# Patient Record
Sex: Female | Born: 1985
Health system: Southern US, Community
[De-identification: ages and names within clinical notes are randomized; demographics above are authoritative.]

## PROBLEM LIST (undated history)

## (undated) DIAGNOSIS — F329 Major depressive disorder, single episode, unspecified: Secondary | ICD-10-CM

## (undated) DIAGNOSIS — R519 Headache, unspecified: Secondary | ICD-10-CM

## (undated) DIAGNOSIS — R51 Headache: Secondary | ICD-10-CM

## (undated) DIAGNOSIS — F410 Panic disorder [episodic paroxysmal anxiety] without agoraphobia: Secondary | ICD-10-CM

## (undated) DIAGNOSIS — F32A Depression, unspecified: Secondary | ICD-10-CM

## (undated) HISTORY — PX: ABDOMINAL SURGERY: SHX537

## (undated) HISTORY — DX: Headache: R51

## (undated) HISTORY — DX: Major depressive disorder, single episode, unspecified: F32.9

## (undated) HISTORY — PX: EYE SURGERY: SHX253

## (undated) HISTORY — DX: Headache, unspecified: R51.9

## (undated) HISTORY — DX: Depression, unspecified: F32.A

---

## 2014-12-16 ENCOUNTER — Encounter (HOSPITAL_COMMUNITY): Payer: Self-pay

## 2014-12-16 ENCOUNTER — Emergency Department (HOSPITAL_COMMUNITY)
Admission: EM | Admit: 2014-12-16 | Discharge: 2014-12-16 | Disposition: A | Discharge status: Home / Self Care | Payer: Non-veteran care | Attending: Emergency Medicine | Admitting: Emergency Medicine

## 2014-12-16 DIAGNOSIS — Z8659 Personal history of other mental and behavioral disorders: Secondary | ICD-10-CM | POA: Diagnosis not present

## 2014-12-16 DIAGNOSIS — R002 Palpitations: Secondary | ICD-10-CM

## 2014-12-16 HISTORY — DX: Panic disorder (episodic paroxysmal anxiety): F41.0

## 2014-12-16 LAB — BASIC METABOLIC PANEL
Anion gap: 6 (ref 5–15)
BUN: 15 mg/dL (ref 6–23)
CO2: 25 mmol/L (ref 19–32)
Calcium: 8.9 mg/dL (ref 8.4–10.5)
Chloride: 104 mmol/L (ref 96–112)
Creatinine, Ser: 0.74 mg/dL (ref 0.50–1.10)
GFR calc Af Amer: >90 mL/min (ref >90)
GFR calc non Af Amer: >90 mL/min (ref >90)
Glucose, Bld: 101 mg/dL — ABNORMAL HIGH (ref 70–99)
Potassium: 4.3 mmol/L (ref 3.5–5.1)
Sodium: 135 mmol/L (ref 135–145)

## 2014-12-16 LAB — I-STAT TROPONIN, ED: Troponin i, poc: 0 ng/mL (ref 0.00–0.08)

## 2014-12-16 LAB — CBC
HCT: 38.3 % (ref 36.0–46.0)
Hemoglobin: 13.4 g/dL (ref 12.0–15.0)
MCH: 29.8 pg (ref 26.0–34.0)
MCHC: 35 g/dL (ref 30.0–36.0)
MCV: 85.1 fL (ref 78.0–100.0)
Platelets: 210 10*3/uL (ref 150–400)
RBC: 4.5 MIL/uL (ref 3.87–5.11)
RDW: 14.1 % (ref 11.5–15.5)
WBC: 7.7 10*3/uL (ref 4.0–10.5)

## 2014-12-16 NOTE — ED Notes (Signed)
Pt verbalized understanding of d/c instructions and given information to follow up with a cardiologist and has no further questions.

## 2014-12-16 NOTE — ED Provider Notes (Signed)
CSN: [id number]     Arrival date & time 12/16/14  0247 History   First MD Initiated Contact with Patient 12/16/14 0545     Chief Complaint  Patient presents with  . Palpitations     (Consider location/radiation/quality/duration/timing/severity/associated sxs/prior Treatment) Patient is a 29 y.o. female presenting with palpitations. The history is provided by the patient.  Palpitations She woke up at about 12:30 AM with a sense that her heart was beating faster than it should. There was no chest pain, heaviness, tightness, pressure. There is mild dyspnea but no nausea or diaphoresis. She did feel somewhat lightheaded and noted dry mouth. She was not aware of any numbness in her hands or feet or any circumoral numbness. Symptoms lasted about 20 minutes before resolving. She did check her pulse during this episode, and it was about 100.  Past Medical History  Diagnosis Date  . Panic attack    Past Surgical History  Procedure Laterality Date  . Eye surgery     History reviewed. No pertinent family history. History  Substance Use Topics  . Smoking status: Never Smoker   . Smokeless tobacco: Not on file  . Alcohol Use: Yes     Comment: social   OB History    No data available     Review of Systems  Unable to perform ROS Cardiovascular: Positive for palpitations.  All other systems reviewed and are negative.     Allergies  Ceclor  Home Medications   Prior to Admission medications   Medication Sig Start Date End Date Taking? Authorizing Provider  Multiple Vitamins-Calcium (ONE-A-DAY WOMENS PO) Take 1 tablet by mouth daily.   Yes Historical Provider, MD   BP 93/63 mmHg  Pulse 87  Temp(Src) 98.2 F (36.8 C) (Oral)  Resp 15  Ht  (1.6 m)  Wt 125 lb (56.7 kg)  BMI 22.15 kg/m2  SpO2 100%  LMP 11/15/2014 Physical Exam 29 year old female, resting comfortably and in no acute distress. Vital signs are normal. Oxygen saturation is 100%, which is normal. Head is  normocephalic and atraumatic. PERRLA, EOMI. Oropharynx is clear. Neck is nontender and supple without adenopathy or JVD. Back is nontender and there is no CVA tenderness. Lungs are clear without rales, wheezes, or rhonchi. Chest is nontender. Heart has regular rate and rhythm without murmur. Abdomen is soft, flat, nontender without masses or hepatosplenomegaly and peristalsis is normoactive. Extremities have no cyanosis or edema, full range of motion is present. Skin is warm and dry without rash. Neurologic: Mental status is normal, cranial nerves are intact, there are no motor or sensory deficits.  ED Course  Procedures (including critical care time) Labs Review Results for orders placed or performed during the hospital encounter of 12/16/14  CBC  Result Value Ref Range   WBC 7.7 4.0 - 10.5 K/uL   RBC 4.50 3.87 - 5.11 MIL/uL   Hemoglobin 13.4 12.0 - 15.0 g/dL   HCT 40.9 81.1 - 91.4 %   MCV 85.1 78.0 - 100.0 fL   MCH 29.8 26.0 - 34.0 pg   MCHC 35.0 30.0 - 36.0 g/dL   RDW 78.2 95.6 - 21.3 %   Platelets 210 150 - 400 K/uL  Basic metabolic panel  Result Value Ref Range   Sodium 135 135 - 145 mmol/L   Potassium 4.3 3.5 - 5.1 mmol/L   Chloride 104 96 - 112 mmol/L   CO2 25 19 - 32 mmol/L   Glucose, Bld 101 (H) 70 - 99  mg/dL   BUN 15 6 - 23 mg/dL   Creatinine, Ser 0.74 0.50 - 1.10 mg/dL   Calcium 8.9 8.4 - 81.110.5 mg/dL   GFR calc non Af Amer >90 >90 mL/min   GFR calc4.09 Af Amer >90 >90 mL/min   Anion gap 6 5 - 15  I-stat troponin, ED ([facility])  Result Value Ref Range   Troponin i, poc 0.00 0.00 - 0.08 ng/mL   Comment 3            EKG Interpretation   Date/Time:  Friday December 16 2014 02:52:00 EDT Ventricular Rate:  93 PR Interval:  120 QRS Duration: 80 QT Interval:  326 QTC Calculation: 405 R Axis:   75 Text Interpretation:  Normal sinus rhythm Normal ECG No old tracing to  compare Confirmed by [facility]  MD, Andreka Stucki ([id number]) on 12/16/2014 2:54:34 AM      MDM   Final  diagnoses:  Palpitations    Palpitations with what clinically sounds like an episode of hyperventilation. I am not sure if her counting the was accurate. I have discussed with her diagnostic options which would rest mainly on an event monitor. She is referred to cardiology for outpatient follow-up. Laboratory workup in the ED was unremarkable.    Dione Boozeavid Timesha Cervantez, MD 12/16/14 [phone]

## 2014-12-16 NOTE — ED Notes (Signed)
Pt here for palpitations while sleeping, sts woke her around 1230, hx of panic attacks but never been woke up from sleep with it. Denies pain. sts did have episode of lightheadedness with episode.

## 2014-12-16 NOTE — Discharge Instructions (Signed)

## 2015-09-13 ENCOUNTER — Encounter (HOSPITAL_COMMUNITY): Payer: Self-pay | Admitting: Emergency Medicine

## 2015-09-13 ENCOUNTER — Emergency Department (HOSPITAL_COMMUNITY)
Admission: EM | Admit: 2015-09-13 | Discharge: 2015-09-14 | Disposition: A | Discharge status: Home / Self Care | Payer: Medicaid Other | Attending: Emergency Medicine | Admitting: Emergency Medicine

## 2015-09-13 DIAGNOSIS — N730 Acute parametritis and pelvic cellulitis: Secondary | ICD-10-CM

## 2015-09-13 DIAGNOSIS — Z79899 Other long term (current) drug therapy: Secondary | ICD-10-CM | POA: Diagnosis not present

## 2015-09-13 DIAGNOSIS — Z8659 Personal history of other mental and behavioral disorders: Secondary | ICD-10-CM | POA: Diagnosis not present

## 2015-09-13 DIAGNOSIS — R102 Pelvic and perineal pain: Secondary | ICD-10-CM

## 2015-09-13 DIAGNOSIS — R112 Nausea with vomiting, unspecified: Secondary | ICD-10-CM | POA: Diagnosis present

## 2015-09-13 DIAGNOSIS — Z3202 Encounter for pregnancy test, result negative: Secondary | ICD-10-CM | POA: Diagnosis not present

## 2015-09-13 DIAGNOSIS — B9689 Other specified bacterial agents as the cause of diseases classified elsewhere: Secondary | ICD-10-CM

## 2015-09-13 DIAGNOSIS — N760 Acute vaginitis: Secondary | ICD-10-CM | POA: Insufficient documentation

## 2015-09-13 LAB — URINALYSIS, ROUTINE W REFLEX MICROSCOPIC
Bilirubin Urine: NEGATIVE
Glucose, UA: NEGATIVE mg/dL
Hgb urine dipstick: NEGATIVE
Ketones, ur: 40 mg/dL — AB
Leukocytes, UA: NEGATIVE
Nitrite: NEGATIVE
Protein, ur: NEGATIVE mg/dL
Specific Gravity, Urine: 1.028 (ref 1.005–1.030)
pH: 6.5 (ref 5.0–8.0)

## 2015-09-13 LAB — CBC
HCT: 39.3 % (ref 36.0–46.0)
Hemoglobin: 13.2 g/dL (ref 12.0–15.0)
MCH: 29.7 pg (ref 26.0–34.0)
MCHC: 33.6 g/dL (ref 30.0–36.0)
MCV: 88.3 fL (ref 78.0–100.0)
Platelets: 256 10*3/uL (ref 150–400)
RBC: 4.45 MIL/uL (ref 3.87–5.11)
RDW: 12.9 % (ref 11.5–15.5)
WBC: 9.7 10*3/uL (ref 4.0–10.5)

## 2015-09-13 LAB — COMPREHENSIVE METABOLIC PANEL
ALT: 22 U/L (ref 14–54)
AST: 24 U/L (ref 15–41)
Albumin: 4 g/dL (ref 3.5–5.0)
Alkaline Phosphatase: 39 U/L (ref 38–126)
Anion gap: 11 (ref 5–15)
BUN: 11 mg/dL (ref 6–20)
CO2: 24 mmol/L (ref 22–32)
Calcium: 9.4 mg/dL (ref 8.9–10.3)
Chloride: 103 mmol/L (ref 101–111)
Creatinine, Ser: 0.8 mg/dL (ref 0.44–1.00)
GFR calc Af Amer: >60 mL/min (ref >60)
GFR calc non Af Amer: >60 mL/min (ref >60)
Glucose, Bld: 96 mg/dL (ref 65–99)
Potassium: 3.9 mmol/L (ref 3.5–5.1)
Sodium: 138 mmol/L (ref 135–145)
Total Bilirubin: 0.9 mg/dL (ref 0.3–1.2)
Total Protein: 6.9 g/dL (ref 6.5–8.1)

## 2015-09-13 LAB — LIPASE, BLOOD: Lipase: 21 U/L (ref 11–51)

## 2015-09-13 LAB — POC URINE PREG, ED: Preg Test, Ur: NEGATIVE

## 2015-09-13 MED ORDER — ONDANSETRON 4 MG PO TBDP
4.0000 mg | ORAL_TABLET | Freq: Once | ORAL | Status: AC
  Administered 2015-09-13: 4 mg via ORAL
  Filled 2015-09-13: qty 1

## 2015-09-13 NOTE — ED Notes (Signed)
Pt. reports persistent nausea/emesis onset today , fatigue , " shaking" , pt. suspects " alcohol poisoning" states large amount of ETOH intake last night . Denies hallucinations / respirations unlabored.

## 2015-09-13 NOTE — ED Provider Notes (Signed)
By signing my name below, I, Soijett Blue, attest that this documentation has been prepared under the direction and in the presence of Enbridge EnergyKristen N Zimir Kittleson, DO. Electronically Signed: Soijett Blue, ED Scribe. 09/13/2015. 11:27 PM.  TIME SEEN: 11:19 PM  CHIEF COMPLAINT: Vomiting  HPI:  Amy Cooke is a 29 y.o. female who presents to the Emergency Department complaining of persistent vomiting x 10 onset 8 AM today. She notes that she is concerned about "dehydration and electrolyte balances" and possible "alcohol poisoning" due to the large amount of alcohol intake last night and she is unsure of how much she consumed. She states that she is having associated symptoms of nausea and white vaginal discharge. She is not currently sexually active but her last sexual encounter was several months ago. She states that she has not concern for STDs at this time. States she has never had an STD before and gets checked regularly. She states that she has not tried any medications for the relief for her symptoms. She denies fever, abdominal pain, diarrhea, dysuria, hematuria, vaginal bleeding, malodorous vaginal odor, vaginal itching or burning, and any other symptoms. Denies having an OB-GYN at this time. Denies recent travel outside of the country. Denies sick contacts. She notes that she has a child and she denies concern for pregnancy at this time. Patient's last menstrual period was 08/30/2015.   ROS: See HPI Constitutional: no fever  Eyes: no drainage  ENT: no runny nose   Cardiovascular:  no chest pain  Resp: no SOB  GI:  vomiting GU: no dysuria Integumentary: no rash  Allergy: no hives  Musculoskeletal: no leg swelling  Neurological: no slurred speech ROS otherwise negative  PAST MEDICAL HISTORY/PAST SURGICAL HISTORY:  Past Medical History  Diagnosis Date  . Panic attack     MEDICATIONS:  Prior to Admission medications   Medication Sig Start Date End Date Taking? Authorizing Provider  Multiple  Vitamins-Calcium (ONE-A-DAY WOMENS PO) Take 1 tablet by mouth daily.    Historical Provider, MD    ALLERGIES:  Allergies  Allergen Reactions  . Ceclor [Cefaclor] Other (See Comments)    Childhood allergy    SOCIAL HISTORY:  Social History  Substance Use Topics  . Smoking status: Never Smoker   . Smokeless tobacco: Not on file  . Alcohol Use: Yes     Comment: social    FAMILY HISTORY: No family history on file.  EXAM: BP 106/63 mmHg  Pulse 76  Temp(Src) 98.5 F (36.9 C) (Oral)  Resp 18  SpO2 100%  LMP 08/30/2015 (Approximate) CONSTITUTIONAL: Alert and oriented and responds appropriately to questions. Well-appearing; well-nourished HEAD: Normocephalic EYES: Conjunctivae clear, PERRL ENT: normal nose; no rhinorrhea; moist mucous membranes; pharynx without lesions noted NECK: Supple, no meningismus, no LAD  CARD: RRR; S1 and S2 appreciated; no murmurs, no clicks, no rubs, no gallops RESP: Normal chest excursion without splinting or tachypnea; breath sounds clear and equal bilaterally; no wheezes, no rhonchi, no rales, no hypoxia or respiratory distress, speaking full sentences ABD/GI: Normal bowel sounds; non-distended; soft, non-tender, no rebound, no guarding, no peritoneal signs GU: nl external genitalia. No vaginal bleeding. Large amount of white yellow thin vaginal discharge with mild bilateral adnexal tenderness without fullness. Positive cervical motion tenderness.  BACK:  The back appears normal and is non-tender to palpation, there is no CVA tenderness EXT: Normal ROM in all joints; non-tender to palpation; no edema; normal capillary refill; no cyanosis, no calf tenderness or swelling    SKIN: Normal color  for age and race; warm NEURO: Moves all extremities equally, sensation to light touch intact diffusely, cranial nerves II through XII intact PSYCH: The patient's mood and manner are appropriate. Grooming and personal hygiene are appropriate.  MEDICAL DECISION  MAKING: Patient here with complaints of nausea and vomiting likely from alcohol intake last night. Her abdominal exam is completely benign. Labs are unremarkable. Urine shows small amount of ketones. She is given Zofran and she is drinking without any difficulty currently. We'll encourage oral fluids. Pelvic exam reveals cervical motion tenderness and bilateral adnexal tenderness without fullness. Wet prep, gonorrhea and Chlamydia pending. Have offered HIV and syphilis testing which she declines. Will obtain transvaginal ultrasound to evaluate for TOA, torsion. Pregnancy test is negative.  ED PROGRESS: Patient has a normal transvaginal ultrasound. Wet prep is positive for clue cells. She is allergic to Ceclor but does not remember her reaction. Discussed with pharmacist and according to up-to-date recommended treatment with Levaquin and Flagyl. We'll discharge with prescriptions for the same. Have given her outpatient OB/GYN follow-up as well. Discussed return precautions. She verbalized understanding and is comfortable with this plan.    I personally performed the services described in this documentation, which was scribed in my presence. The recorded information has been reviewed and is accurate.       Layla Maw Elen Acero, DO 09/14/15 [phone]

## 2015-09-14 ENCOUNTER — Emergency Department (HOSPITAL_COMMUNITY): Payer: Medicaid Other

## 2015-09-14 LAB — WET PREP, GENITAL
Sperm: NONE SEEN
Trich, Wet Prep: NONE SEEN
Yeast Wet Prep HPF POC: NONE SEEN

## 2015-09-14 LAB — GC/CHLAMYDIA PROBE AMP (~~LOC~~) NOT AT ARMC
Chlamydia: NEGATIVE
Neisseria Gonorrhea: NEGATIVE

## 2015-09-14 MED ORDER — METRONIDAZOLE 500 MG PO TABS: 500.0000 mg | ORAL_TABLET | Freq: Two times a day (BID) | ORAL | Status: DC

## 2015-09-14 MED ORDER — ONDANSETRON 4 MG PO TBDP: 4.0000 mg | ORAL_TABLET | Freq: Two times a day (BID) | ORAL | Status: DC

## 2015-09-14 MED ORDER — CEFTRIAXONE SODIUM 250 MG IJ SOLR
250.0000 mg | Freq: Once | INTRAMUSCULAR | Status: DC
  Filled 2015-09-14: qty 250

## 2015-09-14 MED ORDER — AZITHROMYCIN 250 MG PO TABS
2000.0000 mg | ORAL_TABLET | Freq: Once | ORAL | Status: AC
  Administered 2015-09-14: 2000 mg via ORAL
  Filled 2015-09-14: qty 8

## 2015-09-14 MED ORDER — DOXYCYCLINE HYCLATE 100 MG PO TABS
100.0000 mg | ORAL_TABLET | Freq: Once | ORAL | Status: AC
  Administered 2015-09-14: 100 mg via ORAL
  Filled 2015-09-14: qty 1

## 2015-09-14 MED ORDER — METRONIDAZOLE 500 MG PO TABS
500.0000 mg | ORAL_TABLET | Freq: Once | ORAL | Status: AC
  Administered 2015-09-14: 500 mg via ORAL
  Filled 2015-09-14: qty 1

## 2015-09-14 MED ORDER — LEVOFLOXACIN 500 MG PO TABS: 500.0000 mg | ORAL_TABLET | Freq: Every day | ORAL | Status: DC

## 2015-09-14 MED ORDER — DIPHENHYDRAMINE HCL 25 MG PO CAPS
50.0000 mg | ORAL_CAPSULE | Freq: Once | ORAL | Status: DC
  Filled 2015-09-14: qty 2

## 2015-09-14 NOTE — ED Notes (Signed)
Pt able to keep fluid down 

## 2015-09-14 NOTE — Discharge Instructions (Signed)
Bacterial Vaginosis Bacterial vaginosis is a vaginal infection that occurs when the normal balance of bacteria in the vagina is disrupted. It results from an overgrowth of certain bacteria. This is the most common vaginal infection in women of childbearing age. Treatment is important to prevent complications, especially in pregnant women, as it can cause a premature delivery. CAUSES  Bacterial vaginosis is caused by an increase in harmful bacteria that are normally present in smaller amounts in the vagina. Several different kinds of bacteria can cause bacterial vaginosis. However, the reason that the condition develops is not fully understood. RISK FACTORS Certain activities or behaviors can put you at an increased risk of developing bacterial vaginosis, including:  Having a new sex partner or multiple sex partners.  Douching.  Using an intrauterine device (IUD) for contraception. Women do not get bacterial vaginosis from toilet seats, bedding, swimming pools, or contact with objects around them. SIGNS AND SYMPTOMS  Some women with bacterial vaginosis have no signs or symptoms. Common symptoms include:  Grey vaginal discharge.  A fishlike odor with discharge, especially after sexual intercourse.  Itching or burning of the vagina and vulva.  Burning or pain with urination. DIAGNOSIS  Your health care provider will take a medical history and examine the vagina for signs of bacterial vaginosis. A sample of vaginal fluid may be taken. Your health care provider will look at this sample under a microscope to check for bacteria and abnormal cells. A vaginal pH test may also be done.  TREATMENT  Bacterial vaginosis may be treated with antibiotic medicines. These may be given in the form of a pill or a vaginal cream. A second round of antibiotics may be prescribed if the condition comes back after treatment. Because bacterial vaginosis increases your risk for sexually transmitted diseases, getting  treated can help reduce your risk for chlamydia, gonorrhea, HIV, and herpes. HOME CARE INSTRUCTIONS   Only take over-the-counter or prescription medicines as directed by your health care provider.  If antibiotic medicine was prescribed, take it as directed. Make sure you finish it even if you start to feel better.  Tell all sexual partners that you have a vaginal infection. They should see their health care provider and be treated if they have problems, such as a mild rash or itching.  During treatment, it is important that you follow these instructions:  Avoid sexual activity or use condoms correctly.  Do not douche.  Avoid alcohol as directed by your health care provider.  Avoid breastfeeding as directed by your health care provider. SEEK MEDICAL CARE IF:   Your symptoms are not improving after 3 days of treatment.  You have increased discharge or pain.  You have a fever. MAKE SURE YOU:   Understand these instructions.  Will watch your condition.  Will get help right away if you are not doing well or get worse. FOR MORE INFORMATION  Centers for Disease Control and Prevention, Division of STD Prevention: [employer]co.[employer] American Sexual Health Association (ASHA): www.ashastd.org    This information is not intended to replace advice given to you by your health care provider. Make sure you discuss any questions you have with your health care provider.   Document Released: 09/16/2005 Document Revised: 10/07/2014 Document Reviewed: 04/28/2013 Elsevier Interactive Patient Education 2016 Elsevier Inc.  Nausea and Vomiting Nausea is a sick feeling that often comes before throwing up (vomiting). Vomiting is a reflex where stomach contents come out of your mouth. Vomiting can cause severe loss of body fluids (dehydration).  Children and elderly adults can become dehydrated quickly, especially if they also have diarrhea. Nausea and vomiting are symptoms of a condition or disease. It  is important to find the cause of your symptoms. CAUSES   Direct irritation of the stomach lining. This irritation can result from increased acid production (gastroesophageal reflux disease), infection, food poisoning, taking certain medicines (such as nonsteroidal anti-inflammatory drugs), alcohol use, or tobacco use.  Signals from the brain.These signals could be caused by a headache, heat exposure, an inner ear disturbance, increased pressure in the brain from injury, infection, a tumor, or a concussion, pain, emotional stimulus, or metabolic problems.  An obstruction in the gastrointestinal tract (bowel obstruction).  Illnesses such as diabetes, hepatitis, gallbladder problems, appendicitis, kidney problems, cancer, sepsis, atypical symptoms of a heart attack, or eating disorders.  Medical treatments such as chemotherapy and radiation.  Receiving medicine that makes you sleep (general anesthetic) during surgery. DIAGNOSIS Your caregiver may ask for tests to be done if the problems do not improve after a few days. Tests may also be done if symptoms are severe or if the reason for the nausea and vomiting is not clear. Tests may include:  Urine tests.  Blood tests.  Stool tests.  Cultures (to look for evidence of infection).  X-rays or other imaging studies. Test results can help your caregiver make decisions about treatment or the need for additional tests. TREATMENT You need to stay well hydrated. Drink frequently but in small amounts.You may wish to drink water, sports drinks, clear broth, or eat frozen ice pops or gelatin dessert to help stay hydrated.When you eat, eating slowly may help prevent nausea.There are also some antinausea medicines that may help prevent nausea. HOME CARE INSTRUCTIONS   Take all medicine as directed by your caregiver.  If you do not have an appetite, do not force yourself to eat. However, you must continue to drink fluids.  If you have an  appetite, eat a normal diet unless your caregiver tells you differently.  Eat a variety of complex carbohydrates (rice, wheat, potatoes, bread), lean meats, yogurt, fruits, and vegetables.  Avoid high-fat foods because they are more difficult to digest.  Drink enough water and fluids to keep your urine clear or pale yellow.  If you are dehydrated, ask your caregiver for specific rehydration instructions. Signs of dehydration may include:  Severe thirst.  Dry lips and mouth.  Dizziness.  Dark urine.  Decreasing urine frequency and amount.  Confusion.  Rapid breathing or pulse. SEEK IMMEDIATE MEDICAL CARE IF:   You have blood or brown flecks (like coffee grounds) in your vomit.  You have black or bloody stools.  You have a severe headache or stiff neck.  You are confused.  You have severe abdominal pain.  You have chest pain or trouble breathing.  You do not urinate at least once every 8 hours.  You develop cold or clammy skin.  You continue to vomit for longer than 24 to 48 hours.  You have a fever. MAKE SURE YOU:   Understand these instructions.  Will watch your condition.  Will get help right away if you are not doing well or get worse.   This information is not intended to replace advice given to you by your health care provider. Make sure you discuss any questions you have with your health care provider.   Document Released: 09/16/2005 Document Revised: 12/09/2011 Document Reviewed: 02/13/2011 Elsevier Interactive Patient Education 2016 Elsevier Inc.  Pelvic Inflammatory Disease Pelvic inflammatory  disease (PID) refers to an infection in some or all of the female organs. The infection can be in the uterus, ovaries, fallopian tubes, or the surrounding tissues in the pelvis. PID can cause abdominal or pelvic pain that comes on suddenly (acute pelvic pain). PID is a serious infection because it can lead to lasting (chronic) pelvic pain or the inability to  have children (infertility). CAUSES This condition is most often caused by an infection that is spread during sexual contact. However, the infection can also be caused by the normal bacteria that are found in the vaginal tissues if these bacteria travel upward into the reproductive organs. PID can also occur following:  The birth of a baby.  A miscarriage.  An abortion.  Major pelvic surgery.  The use of an intrauterine device (IUD).  A sexual assault. RISK FACTORS This condition is more likely to develop in women who:  Are younger than 27 years of age.  Are sexually active at [facility] age.  Use nonbarrier contraception.  Have multiple sexual partners.  Have sex with someone who has symptoms of an STD (sexually transmitted disease).  Use oral contraception. At times, certain behaviors can also increase the possibility of getting PID, such as:  Using a vaginal douche.  Having an IUD in place. SYMPTOMS Symptoms of this condition include:  Abdominal or pelvic pain.  Fever.  Chills.  Abnormal vaginal discharge.  Abnormal uterine bleeding.  Unusual pain shortly after the end of a menstrual period.  Painful urination.  Pain with sexual intercourse.  Nausea and vomiting. DIAGNOSIS To diagnose this condition, your health care provider will do a physical exam and take your medical history. A pelvic exam typically reveals great tenderness in the uterus and the surrounding pelvic tissues. You may also have tests, such as:  Lab tests, including a pregnancy test, blood tests, and urine test.  Culture tests of the vagina and cervix to check for an STD.  Ultrasound.  A laparoscopic procedure to look inside the pelvis.  Examining vaginal secretions under a microscope. TREATMENT Treatment for this condition may involve one or more approaches.  Antibiotic medicines may be prescribed to be taken by mouth.  Sexual partners may need to be treated if the infection is  caused by an STD.  For more severe cases, hospitalization may be needed to give antibiotics directly into a vein through an IV tube.  Surgery may be needed if other treatments do not help, but this is rare. It may take weeks until you are completely well. If you are diagnosed with PID, you should also be checked for human immunodeficiency virus (HIV). Your health care provider may test you for infection again 3 months after treatment. You should not have unprotected sex. HOME CARE INSTRUCTIONS  Take over-the-counter and prescription medicines only as told by your health care provider.  If you were prescribed an antibiotic medicine, take it as told by your health care provider. Do not stop taking the antibiotic even if you start to feel better.  Do not have sexual intercourse until treatment is completed or as told by your health care provider. If PID is confirmed, your recent sexual partners will need treatment, especially if you had unprotected sex.  Keep all follow-up visits as told by your health care provider. This is important. SEEK MEDICAL CARE IF:  You have increased or abnormal vaginal discharge.  Your pain does not improve.  You vomit.  You have a fever.  You cannot tolerate your medicines.  Your partner has an STD.  You have pain when you urinate. SEEK IMMEDIATE MEDICAL CARE IF:  You have increased abdominal or pelvic pain.  You have chills.  Your symptoms are not better in 72 hours even with treatment.   This information is not intended to replace advice given to you by your health care provider. Make sure you discuss any questions you have with your health care provider.   Document Released: 09/16/2005 Document Revised: 06/07/2015 Document Reviewed: 10/24/2014 Elsevier Interactive Patient Education 2016 [employer]    [facility] [email or web address] [address] [place], [place] [phone]    [facility] [email or web address] [address] [place], [place] [phone]    [facility] [address] [place], [place] [phone]   [facility] [email or web address] [address] [place], [place] [phone]   [facility] [email or web address] [address] [place], [place] [phone]   [facility] [email or web address] [address] [place], [place] [phone]

## 2015-09-14 NOTE — ED Notes (Signed)
Patient transported to Ultrasound 

## 2016-06-11 ENCOUNTER — Emergency Department (HOSPITAL_COMMUNITY)
Admission: EM | Admit: 2016-06-11 | Discharge: 2016-06-12 | Disposition: A | Discharge status: Home / Self Care | Payer: BLUE CROSS/BLUE SHIELD | Attending: Emergency Medicine | Admitting: Emergency Medicine

## 2016-06-11 DIAGNOSIS — F419 Anxiety disorder, unspecified: Secondary | ICD-10-CM | POA: Diagnosis not present

## 2016-06-11 DIAGNOSIS — R1013 Epigastric pain: Secondary | ICD-10-CM | POA: Insufficient documentation

## 2016-06-11 DIAGNOSIS — F411 Generalized anxiety disorder: Secondary | ICD-10-CM

## 2016-06-11 LAB — CBC
HCT: 40.5 % (ref 36.0–46.0)
Hemoglobin: 13.7 g/dL (ref 12.0–15.0)
MCH: 30.2 pg (ref 26.0–34.0)
MCHC: 33.8 g/dL (ref 30.0–36.0)
MCV: 89.2 fL (ref 78.0–100.0)
Platelets: 261 10*3/uL (ref 150–400)
RBC: 4.54 MIL/uL (ref 3.87–5.11)
RDW: 12.8 % (ref 11.5–15.5)
WBC: 7 10*3/uL (ref 4.0–10.5)

## 2016-06-11 LAB — COMPREHENSIVE METABOLIC PANEL
ALT: 15 U/L (ref 14–54)
AST: 20 U/L (ref 15–41)
Albumin: 4.4 g/dL (ref 3.5–5.0)
Alkaline Phosphatase: 43 U/L (ref 38–126)
Anion gap: 8 (ref 5–15)
BUN: 8 mg/dL (ref 6–20)
CO2: 24 mmol/L (ref 22–32)
Calcium: 9.5 mg/dL (ref 8.9–10.3)
Chloride: 105 mmol/L (ref 101–111)
Creatinine, Ser: 0.85 mg/dL (ref 0.44–1.00)
GFR calc Af Amer: >60 mL/min (ref >60)
GFR calc non Af Amer: >60 mL/min (ref >60)
Glucose, Bld: 97 mg/dL (ref 65–99)
Potassium: 3.6 mmol/L (ref 3.5–5.1)
Sodium: 137 mmol/L (ref 135–145)
Total Bilirubin: 0.6 mg/dL (ref 0.3–1.2)
Total Protein: 6.6 g/dL (ref 6.5–8.1)

## 2016-06-11 LAB — URINALYSIS, ROUTINE W REFLEX MICROSCOPIC
Bilirubin Urine: NEGATIVE
Glucose, UA: NEGATIVE mg/dL
Hgb urine dipstick: NEGATIVE
Ketones, ur: NEGATIVE mg/dL
Leukocytes, UA: NEGATIVE
Nitrite: NEGATIVE
Protein, ur: NEGATIVE mg/dL
Specific Gravity, Urine: <1.005 — ABNORMAL LOW (ref 1.005–1.030)
pH: 5.5 (ref 5.0–8.0)

## 2016-06-11 LAB — LIPASE, BLOOD: Lipase: 35 U/L (ref 11–51)

## 2016-06-11 LAB — POC URINE PREG, ED: Preg Test, Ur: NEGATIVE

## 2016-06-11 MED ORDER — DIAZEPAM 5 MG PO TABS
5.0000 mg | ORAL_TABLET | Freq: Once | ORAL | Status: AC
  Administered 2016-06-11: 5 mg via ORAL
  Filled 2016-06-11: qty 1

## 2016-06-11 MED ORDER — DIAZEPAM 5 MG PO TABS: 5.0000 mg | ORAL_TABLET | Freq: Two times a day (BID) | ORAL | 0 refills | Status: DC | PRN

## 2016-06-11 MED ORDER — GI COCKTAIL ~~LOC~~
30.0000 mL | Freq: Once | ORAL | Status: AC
  Administered 2016-06-11: 30 mL via ORAL
  Filled 2016-06-11 (×2): qty 30

## 2016-06-11 NOTE — Discharge Instructions (Signed)
Follow up with your primary care provider as soon as possible. Take medication as prescribed as needed. Return to the ER for new or worsening symptoms.

## 2016-06-11 NOTE — ED Provider Notes (Signed)
[facility] DEPT Provider Note   CSN: [id number] Arrival date & time: 06/11/16  1958   History   Chief Complaint Chief Complaint  Patient presents with  . Abdominal Pain   HPI  Amy Cooke is an 30 y.o. female with history of anxiety who presents to the ED for evaluation of a possible panic attack. She states all day today she has generally felt somewhat anxious and stressed though she states that she is generally an anxious person. She states she was in [employer] studying when she started feeling jittery and restless. She states she shortly thereafter was in the car with her friend on their way to go study when she suddenly felt a general sense of panic and unease. She states it is difficult for her to describe exactly how she felt. She does note she had some epigastric discomfort and felt a sense of doom. She denies chest pain or SOB. She states she has had anxiety for years but is not sure if she has had a panic attack before. She has previously been prescribed Buspar but never started taking it. She does note that she saw PCP yesterday for labs as she has been generally stressed/fatigued with unintentional 8lb weight loss over the past couple of months. 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    Past Medical History:  Diagnosis Date  . Panic attack     There are no active problems to display for this patient.   Past Surgical History:  Procedure Laterality Date  . EYE SURGERY      OB History    No data available       Home Medications    Prior to Admission medications   Medication Sig Start Date End Date Taking? Authorizing Provider  levofloxacin (LEVAQUIN) 500 MG tablet Take 1 tablet (500 mg total) by mouth daily. Patient not taking: Reported on 06/11/2016 09/14/15   Kristen N Ward, DO  metroNIDAZOLE (FLAGYL) 500 MG tablet Take 1 tablet (500 mg  total) by mouth 2 (two) times daily. Do not drink alcohol with this medication. Patient not taking: Reported on 06/11/2016 09/14/15   Kristen N Ward, DO  ondansetron (ZOFRAN ODT) 4 MG disintegrating tablet Take 1 tablet (4 mg total) by mouth 2 (two) times daily. Patient not taking: Reported on 06/11/2016 09/14/15   Layla Maw Ward, DO    Family History No family history on file.  Social History Social History  Substance Use Topics  . Smoking status: Never Smoker  . Smokeless tobacco: Not on file  . Alcohol use Yes     Comment: social     Allergies   Ceclor [cefaclor]   Review of Systems Review of Systems 10 Systems reviewed and are negative for acute change except as noted in the HPI.  Physical Exam Updated Vital Signs BP 119/81   Pulse 97   Temp 97.8 F (36.6 C) (Oral)   Resp 18   LMP 06/05/2016   SpO2 96%   Physical Exam  Constitutional: She is oriented to person, place, and time.  HENT:  Right Ear: External ear normal.  Left Ear: External ear normal.  Nose: Nose normal.  Mouth/Throat: Oropharynx is clear and moist. No oropharyngeal exudate.  Eyes: Conjunctivae are normal.  Neck: Neck supple.  Cardiovascular: Normal rate, regular rhythm, normal heart sounds and intact distal pulses.   Pulmonary/Chest: Effort  normal and breath sounds normal. No respiratory distress. She has no wheezes.  Abdominal: Soft. Bowel sounds are normal. She exhibits no distension. There is no tenderness. There is no rebound and no guarding.  Musculoskeletal: She exhibits no edema.  Lymphadenopathy:    She has no cervical adenopathy.  Neurological: She is alert and oriented to person, place, and time. No cranial nerve deficit.  Skin: Skin is warm and dry. Capillary refill takes less than 2 seconds.  Psychiatric: Her mood appears anxious. She expresses no homicidal and no suicidal ideation.  Nursing note and vitals reviewed.    ED Treatments / Results  Labs (all labs ordered are  listed, but only abnormal results are displayed) Labs Reviewed  URINALYSIS, ROUTINE W REFLEX MICROSCOPIC (NOT AT [facility]) - Abnormal; Notable for the following:       Result Value   Color, Urine STRAW (*)    Specific Gravity, Urine <1.005 (*)    All other components within normal limits  LIPASE, BLOOD  COMPREHENSIVE METABOLIC PANEL  CBC  POC URINE PREG, ED    EKG  EKG Interpretation None       Radiology No results found.  Procedures Procedures (including critical care time)  Medications Ordered in ED Medications  diazepam (VALIUM) tablet 5 mg (5 mg Oral Given 06/11/16 2144)  gi cocktail (Maalox,Lidocaine,Donnatal) (30 mLs Oral Given 06/11/16 2308)     Initial Impression / Assessment and Plan / ED Course  I have reviewed the triage vital signs and the nursing notes.  Pertinent labs & imaging results that were available during my care of the patient were reviewed by me and considered in my medical decision making (see chart for details).  Clinical Course    Labs unrevealing. Abdominal exam benign. Rest of exam nonfocal. Pt does appear anxious in the ED and I suspect her presentation today is secondary to an anxiety attack. She was given a dose of Valium in the ED which pt states did make her feel a bit calmer but she still does not feel 100%. With normal vital signs and normal labs I doubt there is any other organic pathology causing pt's epigastric pain and general malaise. We did give a GI cocktail as well with some improvement in her abdominal discomfort. Pt is not homicidal or suicidal. She has a primary care provider. She is stable for discharge with close outpatient follow up. Will give short prescription for valium as needed. ER return precautions given.  Final Clinical Impressions(s) / ED Diagnoses   Final diagnoses:  Anxiety state  Epigastric pain    New Prescriptions Discharge Medication List as of 06/11/2016 11:30 PM    START taking these medications   Details   diazepam (VALIUM) 5 MG tablet Take 1 tablet (5 mg total) by mouth every 12 (twelve) hours as needed for anxiety., Starting Tue 06/11/2016, Print         Carlene CoriaSerena Y Glendell Schlottman, PA-C 06/12/16 0010    Jerelyn ScottMartha Linker, MD 06/14/16 [phone]

## 2016-06-11 NOTE — ED Notes (Signed)
Pt c/o unexplained anxiety onset today at 1700. Denies chest discomfort or shortness of breath.

## 2016-06-11 NOTE — ED Triage Notes (Signed)
Pt complaining of lower abdominal pain. Pt denies any nausea/vomiting or diarrhea. Pt denies any urinary symptoms or bleeding/discharge. Pt states hx of anxiety, pt states feels similar, but worse.

## 2016-12-02 ENCOUNTER — Encounter (HOSPITAL_COMMUNITY): Payer: Self-pay | Admitting: Nurse Practitioner

## 2016-12-02 ENCOUNTER — Emergency Department (HOSPITAL_COMMUNITY)
Admission: EM | Admit: 2016-12-02 | Discharge: 2016-12-02 | Disposition: A | Discharge status: Home / Self Care | Payer: BLUE CROSS/BLUE SHIELD | Attending: Emergency Medicine | Admitting: Emergency Medicine

## 2016-12-02 ENCOUNTER — Emergency Department (HOSPITAL_COMMUNITY): Payer: BLUE CROSS/BLUE SHIELD

## 2016-12-02 DIAGNOSIS — R1031 Right lower quadrant pain: Secondary | ICD-10-CM | POA: Diagnosis not present

## 2016-12-02 DIAGNOSIS — R1010 Upper abdominal pain, unspecified: Secondary | ICD-10-CM

## 2016-12-02 DIAGNOSIS — R1032 Left lower quadrant pain: Secondary | ICD-10-CM | POA: Insufficient documentation

## 2016-12-02 DIAGNOSIS — R1013 Epigastric pain: Secondary | ICD-10-CM | POA: Diagnosis present

## 2016-12-02 DIAGNOSIS — R1011 Right upper quadrant pain: Secondary | ICD-10-CM | POA: Insufficient documentation

## 2016-12-02 LAB — COMPREHENSIVE METABOLIC PANEL
ALT: 15 U/L (ref 14–54)
AST: 20 U/L (ref 15–41)
Albumin: 3.9 g/dL (ref 3.5–5.0)
Alkaline Phosphatase: 47 U/L (ref 38–126)
Anion gap: 7 (ref 5–15)
BUN: 16 mg/dL (ref 6–20)
CO2: 25 mmol/L (ref 22–32)
Calcium: 9.2 mg/dL (ref 8.9–10.3)
Chloride: 105 mmol/L (ref 101–111)
Creatinine, Ser: 0.86 mg/dL (ref 0.44–1.00)
GFR calc Af Amer: >60 mL/min (ref >60)
GFR calc non Af Amer: >60 mL/min (ref >60)
Glucose, Bld: 110 mg/dL — ABNORMAL HIGH (ref 65–99)
Potassium: 3.6 mmol/L (ref 3.5–5.1)
Sodium: 137 mmol/L (ref 135–145)
Total Bilirubin: 0.4 mg/dL (ref 0.3–1.2)
Total Protein: 7.3 g/dL (ref 6.5–8.1)

## 2016-12-02 LAB — CBC
HCT: 40.5 % (ref 36.0–46.0)
Hemoglobin: 14 g/dL (ref 12.0–15.0)
MCH: 30.3 pg (ref 26.0–34.0)
MCHC: 34.6 g/dL (ref 30.0–36.0)
MCV: 87.7 fL (ref 78.0–100.0)
Platelets: 224 10*3/uL (ref 150–400)
RBC: 4.62 MIL/uL (ref 3.87–5.11)
RDW: 12.6 % (ref 11.5–15.5)
WBC: 6.8 10*3/uL (ref 4.0–10.5)

## 2016-12-02 LAB — I-STAT BETA HCG BLOOD, ED (MC, WL, AP ONLY): I-stat hCG, quantitative: <5 m[IU]/mL (ref <5)

## 2016-12-02 LAB — URINALYSIS, ROUTINE W REFLEX MICROSCOPIC
Bilirubin Urine: NEGATIVE
Glucose, UA: NEGATIVE mg/dL
Hgb urine dipstick: NEGATIVE
Ketones, ur: 5 mg/dL — AB
Leukocytes, UA: NEGATIVE
Nitrite: NEGATIVE
Protein, ur: NEGATIVE mg/dL
Specific Gravity, Urine: 1.013 (ref 1.005–1.030)
pH: 8 (ref 5.0–8.0)

## 2016-12-02 LAB — LIPASE, BLOOD: Lipase: 21 U/L (ref 11–51)

## 2016-12-02 MED ORDER — PANTOPRAZOLE SODIUM 20 MG PO TBEC: 20.0000 mg | DELAYED_RELEASE_TABLET | Freq: Every day | ORAL | 0 refills | Status: DC

## 2016-12-02 NOTE — Discharge Instructions (Signed)
Your workup today was very reassuring. We advise that you follow-up with a gastroenterologist for further evaluation of your pain. As your symptoms may be due to gastritis, we advise that you start Protonix as prescribed to see if you have any improvement in your pain with this medication. You may return to the emergency department as needed for new or concerning symptoms.

## 2016-12-02 NOTE — ED Triage Notes (Signed)
Pt presents with c/o upper abd and back pain. The pain began about 2 months ago and has been intermittent since. The pain returned this weekend and has not improved since Saturday evening. She reports feeling lightheaded and nauseated. She denies fevers, chills, body aches, urinary symptoms, vaginal discharge or bleeding, constipation or diarrhea.. She has not tried anything at home for the pain. She has been [facility] and they ordered imaging tests but she did not follow up for hte tests as scheduled.

## 2016-12-02 NOTE — ED Provider Notes (Signed)
[facility] Provider Note   CSN: [id number] Arrival date & time: 12/02/16  1515     History   Chief Complaint Chief Complaint  Patient presents with  . Abdominal Pain    HPI Amy Cooke is a 31 y.o. female.  Pt with PMHx of anxiety, presents with epigastric abdominal pain with radiation to her back that started Saturday evening. Reports recent intermittent episodes x2 months. Pain is constant, 4/10, with associated nausea, not related to eating. Denies vomiting, heartburn, diarrhea, constipation, dysuria, vaginal bleeding or discharge, chest pain, SOB. Pt has not taken anything for the pain but laying flat helps. Reports abdominal surgery as an infant with limited knowledge about the details - reports surgery on kidneys 2/t E.coli infection in abdomen. Reports one episode of lightheadedness which she associated with her anxiety at the time. EtOH use in moderation, denies smoking or elicit drug use. She is not currently taking any daily medications.     Abdominal Pain   Associated symptoms include nausea. Pertinent negatives include fever, diarrhea, vomiting, constipation and dysuria.    Past Medical History:  Diagnosis Date  . Panic attack     There are no active problems to display for this patient.   Past Surgical History:  Procedure Laterality Date  . ABDOMINAL SURGERY    . EYE SURGERY      OB History    No data available       Home Medications    Prior to Admission medications   Medication Sig Start Date End Date Taking? Authorizing Provider  diazepam (VALIUM) 5 MG tablet Take 1 tablet (5 mg total) by mouth every 12 (twelve) hours as needed for anxiety. 06/11/16   Ace Gins Sam, PA-C  levofloxacin (LEVAQUIN) 500 MG tablet Take 1 tablet (500 mg total) by mouth daily. Patient not taking: Reported on 06/11/2016 09/14/15   Kristen N Ward, DO  metroNIDAZOLE (FLAGYL) 500 MG tablet Take 1 tablet (500 mg total) by mouth 2 (two) times daily. Do not drink alcohol  with this medication. Patient not taking: Reported on 06/11/2016 09/14/15   Kristen N Ward, DO  ondansetron (ZOFRAN ODT) 4 MG disintegrating tablet Take 1 tablet (4 mg total) by mouth 2 (two) times daily. Patient not taking: Reported on 06/11/2016 09/14/15   Layla Maw Ward, DO    Family History History reviewed. No pertinent family history.  Social History Social History  Substance Use Topics  . Smoking status: Never Smoker  . Smokeless tobacco: Never Used  . Alcohol use Yes     Comment: social     Allergies   Ceclor [cefaclor]   Review of Systems Review of Systems  Constitutional: Negative for appetite change, chills and fever.  Respiratory: Negative for shortness of breath.   Cardiovascular: Negative for chest pain.  Gastrointestinal: Positive for abdominal pain and nausea. Negative for blood in stool, constipation, diarrhea and vomiting.  Genitourinary: Negative for difficulty urinating, dysuria, vaginal bleeding and vaginal discharge.  Musculoskeletal: Positive for back pain.  Neurological: Positive for light-headedness.     Physical Exam Updated Vital Signs BP 120/76   Pulse 74   Temp 99 F (37.2 C) (Oral)   Resp 19   LMP 11/25/2016   SpO2 100%   Physical Exam  Constitutional: She is oriented to person, place, and time. She appears well-developed and well-nourished. No distress.  Cardiovascular: Normal rate, regular rhythm, normal heart sounds and intact distal pulses.   Pulmonary/Chest: Effort normal and breath sounds normal.  Abdominal: Soft.  Bowel sounds are normal. She exhibits no distension and no mass. There is tenderness in the right upper quadrant, right lower quadrant, epigastric area and left lower quadrant. There is positive Murphy's sign. There is no rigidity, no rebound, no guarding and no CVA tenderness.  Musculoskeletal: She exhibits no tenderness.  Neurological: She is alert and oriented to person, place, and time.  Skin: Skin is warm and dry.      ED Treatments / Results  Labs (all labs ordered are listed, but only abnormal results are displayed) Labs Reviewed  COMPREHENSIVE METABOLIC PANEL - Abnormal; Notable for the following:       Result Value   Glucose, Bld 110 (*)    All other components within normal limits  LIPASE, BLOOD  CBC  URINALYSIS, ROUTINE W REFLEX MICROSCOPIC  I-STAT BETA HCG BLOOD, ED ([facility], [facility], [facility] ONLY)    EKG  EKG Interpretation None       Radiology No results found.  Procedures Procedures (including critical care time)  Medications Ordered in ED Medications - No data to display   Initial Impression / Assessment and Plan / ED Course  I have reviewed the triage vital signs and the nursing notes.  Pertinent labs & imaging results that were available during my care of the patient were reviewed by me and considered in my medical decision making (see chart for details).    Pt presented with intermittent abdominal pain with radiation to the back since Saturday evening. No vomiting, pain not associated with food, EtOH. Tenderness in epigastric region as well lower abdomen, otherwise unremarkable. Pt has been afebrile, nontoxic, labs are reassuring. Sent for ultrasound of abdomen.  Sign out to [employer]Kelly Humes, VF CorporationPA-C. Pending ultrasound and U/A.  Final Clinical Impressions(s) / ED Diagnoses   Final diagnoses:  None    New Prescriptions New Prescriptions   No medications on file     [place] Mauro Kaufmannicole Russo, PA-C 12/02/16 2011    Jacalyn LefevreJulie Haviland, MD 12/02/16 2032

## 2016-12-02 NOTE — ED Provider Notes (Signed)
10:11 PM Patient care assumed from Amy Russo, PA-C at change of shift. Patient with urinalysis and ultrasound pending. These have resulted and have been reviewed. Ultrasound negative for acute process and urinalysis does not suggest infection.  Given reassuring workup and chronicity of symptoms, low suspicion for emergent etiology of pain. The patient will be referred to gastroenterology for further workup. Will start on Protonix given that patient complains mostly of epigastric pain. Return precautions discussed and provided. Patient discharged in stable condition with no unaddressed concerns.   Results for orders placed or performed during the hospital encounter of 12/02/16  Lipase, blood  Result Value Ref Range   Lipase 21 11 - 51 U/L  Comprehensive metabolic panel  Result Value Ref Range   Sodium 137 135 - 145 mmol/L   Potassium 3.6 3.5 - 5.1 mmol/L   Chloride 105 101 - 111 mmol/L   CO2 25 22 - 32 mmol/L   Glucose, Bld 110 (H) 65 - 99 mg/dL   BUN 16 6 - 20 mg/dL   Creatinine, Ser 2.950.86 0.44 - 1.00 mg/dL   Calcium 9.2 8.9 - 62.110.3 mg/dL   Total Protein 7.3 6.5 - 8.1 g/dL   Albumin 3.9 3.5 - 5.0 g/dL   AST 20 15 - 41 U/L   ALT 15 14 - 54 U/L   Alkaline Phosphatase 47 38 - 126 U/L   Total Bilirubin 0.4 0.3 - 1.2 mg/dL   GFR calc non Af Amer >60 >60 mL/min   GFR calc Af Amer >60 >60 mL/min   Anion gap 7 5 - 15  CBC  Result Value Ref Range   WBC 6.8 4.0 - 10.5 K/uL   RBC 4.62 3.87 - 5.11 MIL/uL   Hemoglobin 14.0 12.0 - 15.0 g/dL   HCT 30.840.5 65.736.0 - 84.646.0 %   MCV 87.7 78.0 - 100.0 fL   MCH 30.3 26.0 - 34.0 pg   MCHC 34.6 30.0 - 36.0 g/dL   RDW 96.212.6 95.211.5 - 84.115.5 %   Platelets 224 150 - 400 K/uL  Urinalysis, Routine w reflex microscopic  Result Value Ref Range   Color, Urine YELLOW YELLOW   APPearance CLEAR CLEAR   Specific Gravity, Urine 1.013 1.005 - 1.030   pH 8.0 5.0 - 8.0   Glucose, UA NEGATIVE NEGATIVE mg/dL   Hgb urine dipstick NEGATIVE NEGATIVE   Bilirubin Urine  NEGATIVE NEGATIVE   Ketones, ur 5 (A) NEGATIVE mg/dL   Protein, ur NEGATIVE NEGATIVE mg/dL   Nitrite NEGATIVE NEGATIVE   Leukocytes, UA NEGATIVE NEGATIVE  I-Stat beta hCG blood, ED  Result Value Ref Range   I-stat hCG, quantitative <5.0 <5 mIU/mL   Comment 3           [place] Abdomen Complete  Result Date: 12/02/2016 CLINICAL DATA:  31 year old female with progressive epigastric abdominal pain and back pain. Initial encounter. EXAM: ABDOMEN ULTRASOUND COMPLETE COMPARISON:  Pelvis ultrasound 09/14/2015 FINDINGS: Gallbladder: No gallstones or wall thickening visualized. No sonographic Murphy sign noted by sonographer. Common bile duct: Diameter: 2 mm, normal Liver: No focal lesion identified. Within normal limits in parenchymal echogenicity. No intrahepatic biliary ductal dilatation. IVC: No abnormality visualized. Pancreas: Visualized portion unremarkable. Spleen: Size and appearance within normal limits. Right Kidney: Length: 11.4 cm. Echogenicity within normal limits. No mass or hydronephrosis visualized. Left Kidney: Length: 10.2 cm. Echogenicity within normal limits. No mass or hydronephrosis visualized. Abdominal aorta: No aneurysm visualized. Other findings: None. IMPRESSION: Normal gallbladder.  Normal ultrasound appearance of the abdomen. Electronically  Signed   By: Odessa Fleming M.D.   On: 12/02/2016 20:18      Antony Madura, PA-C 12/02/16 2213    Jacalyn Lefevre, MD 12/02/16 [phone]

## 2018-07-21 ENCOUNTER — Ambulatory Visit: Payer: Non-veteran care | Admitting: Family Medicine

## 2018-07-21 ENCOUNTER — Ambulatory Visit (INDEPENDENT_AMBULATORY_CARE_PROVIDER_SITE_OTHER): Payer: No Typology Code available for payment source | Admitting: Family Medicine

## 2018-07-21 ENCOUNTER — Encounter: Payer: Self-pay | Admitting: Family Medicine

## 2018-07-21 VITALS — BP 96/60 | HR 94 | Temp 98.6°F | Ht 63.0 in | Wt 123.0 lb

## 2018-07-21 DIAGNOSIS — R1013 Epigastric pain: Secondary | ICD-10-CM

## 2018-07-21 DIAGNOSIS — Z7689 Persons encountering health services in other specified circumstances: Secondary | ICD-10-CM

## 2018-07-21 MED ORDER — ESOMEPRAZOLE MAGNESIUM 20 MG PO CPDR: 20.0000 mg | DELAYED_RELEASE_CAPSULE | Freq: Every day | ORAL | 2 refills | Status: DC

## 2018-07-21 NOTE — Progress Notes (Signed)
Patient presents to clinic today to establish care.  SUBJECTIVE: PMH:  Pt is a 32 yo female with pmh sig for h/o depression.  Pt was previously seen at [facility].  Pt notes concern about abdominal apin.  Pt endorses epigastric pain off and on.  [facility] but still notes pain.  Pt tried taking protonix, but did not like the way it made her feel.  Pt does not note any relation to pain and food.    Allergies: Ceclor- unsure of rxn.  Had other meds in the family which caused drop in bp per pt.  Past surgical history: PRK vision correction 2008  Social history: Pt is a former member of [employer] where she served 8 years.  Pt endorses social alcohol use.  Pt denies tobacco and drug use.  Health Maintenance: PAP --2017 or 18  Past Medical History:  Diagnosis Date  . Depression   . Frequent headaches   . Panic attack     Past Surgical History:  Procedure Laterality Date  . ABDOMINAL SURGERY    . EYE SURGERY      No current outpatient medications on file prior to visit.   No current facility-administered medications on file prior to visit.     Allergies  Allergen Reactions  . Ceclor [Cefaclor] Other (See Comments)    Childhood allergy    History reviewed. No pertinent family history.  Social History   Socioeconomic History  . Marital status: Single    Spouse name: Not on file  . Number of children: Not on file  . Years of education: Not on file  . Highest education level: Not on file  Occupational History  . Not on file  Social Needs  . Financial resource strain: Not on file  . Food insecurity:    Worry: Not on file    Inability: Not on file  . Transportation needs:    Medical: Not on file    Non-medical: Not on file  Tobacco Use  . Smoking status: Never Smoker  . Smokeless tobacco: Never Used  Substance and Sexual Activity  . Alcohol use: Yes    Comment: social  . Drug use: No  . Sexual activity: Never    Birth  control/protection: None  Lifestyle  . Physical activity:    Days per week: Not on file    Minutes per session: Not on file  . Stress: Not on file  Relationships  . Social connections:    Talks on phone: Not on file    Gets together: Not on file    Attends religious service: Not on file    Active member of club or organization: Not on file    Attends meetings of clubs or organizations: Not on file    Relationship status: Not on file  . Intimate partner violence:    Fear of current or ex partner: Not on file    Emotionally abused: Not on file    Physically abused: Not on file    Forced sexual activity: Not on file  Other Topics Concern  . Not on file  Social History Narrative  . Not on file    ROS General: Denies fever, chills, night sweats, changes in weight, changes in appetite HEENT: Denies headaches, ear pain, changes in vision, rhinorrhea, sore throat CV: Denies CP, palpitations, SOB, orthopnea Pulm: Denies SOB, cough, wheezing GI: Denies nausea, vomiting, diarrhea, constipation  +epigastric abdominal pain GU:  Denies dysuria, hematuria, frequency, vaginal discharge Msk: Denies muscle cramps, joint pains Neuro: Denies weakness, numbness, tingling Skin: Denies rashes, bruising Psych: Denies depression, anxiety, hallucinations  BP 96/60 (BP Location: Left Arm, Patient Position: Sitting, Cuff Size: Normal)   Pulse 94   Temp 98.6 F (37 C) (Oral)   Ht 5\' 3"  (1.6 m)   Wt 123 lb (55.8 kg)   LMP 07/15/2018 (Exact Date)   SpO2 98%   BMI 21.79 kg/m   Physical Exam Gen. Pleasant, well developed, well-nourished, in NAD Lungs: no use of accessory muscles, CTAB, no wheezes, rales or rhonchi Cardiovascular: RRR, No r/g/m, no peripheral edema Abdomen: BS present, soft, nontender,nondistended Neuro:  A&Ox3, CN II-XII intact, normal gait Skin:  Warm, dry, intact, no lesions   No results found for this or any previous visit (from the past 2160  hour(s)).  Assessment/Plan: Epigastric pain  -discussed retrying a ppi given h/o negative EGD. -will obtain records from the [place] -pt to keep a diary of abdominal pain - Plan: esomeprazole (NEXIUM) 20 MG capsule, Ambulatory referral to Gastroenterology  Encounter to establish care -We reviewed the PMH, PSH, FH, SH, Meds and Allergies. -We provided refills for any medications we will prescribe as needed. -We addressed current concerns per orders and patient instructions. -We have asked for records for pertinent exams, studies, vaccines and notes from previous providers. -We have advised patient to follow up per instructions below.  F/u in 1 month prn  Abbe Amsterdam, MD

## 2018-07-21 NOTE — Patient Instructions (Signed)

## 2018-07-22 ENCOUNTER — Encounter: Payer: Self-pay | Admitting: Gastroenterology

## 2018-08-19 ENCOUNTER — Ambulatory Visit (INDEPENDENT_AMBULATORY_CARE_PROVIDER_SITE_OTHER): Payer: No Typology Code available for payment source | Admitting: Gastroenterology

## 2018-08-19 ENCOUNTER — Encounter: Payer: Self-pay | Admitting: Gastroenterology

## 2018-08-19 VITALS — BP 108/64 | HR 58 | Ht 63.0 in | Wt 125.0 lb

## 2018-08-19 DIAGNOSIS — R1013 Epigastric pain: Secondary | ICD-10-CM | POA: Diagnosis not present

## 2018-08-19 NOTE — Patient Instructions (Addendum)
HIDA scan to estimate GB ejection fraction for intermittent epigastric pains.  [place] [place], EGD 2018 or 2019 (send for records).  [facility] on 08/26/18. Please arrive 15 minutes prior to your scheduled appointment at  [id number]. Make certain not to have anything to eat or drink at least 6 hours prior to your test. Should this appointment date or time not work well for you, please call radiology scheduling at [phone].  _____________________________________________________________________ hepatobiliary (HIDA) scan is an imaging procedure used to diagnose problems in the liver, gallbladder and bile ducts. In the HIDA scan, a radioactive chemical or tracer is injected into a vein in your arm. The tracer is handled by the liver like bile. Bile is a fluid produced and excreted by your liver that helps your digestive system break down fats in the foods you eat. Bile is stored in your gallbladder and the gallbladder releases the bile when you eat a meal. A special nuclear medicine scanner (gamma camera) tracks the flow of the tracer from your liver into your gallbladder and small intestine.  During your HIDA scan  You'll be asked to change into a hospital gown before your HIDA scan begins. Your health care team will position you on a table, usually on your back. The radioactive tracer is then injected into a vein in your arm.The tracer travels through your bloodstream to your liver, where it's taken up by the bile-producing cells. The radioactive tracer travels with the bile from your liver into your gallbladder and through your bile ducts to your small intestine.You may feel some pressure while the radioactive tracer is injected into your vein. As you lie on the table, a special gamma camera is positioned over your abdomen taking pictures of the tracer as it moves through your body. The gamma camera takes pictures continually for  about an hour. You'll need to keep still during the HIDA scan. This can become uncomfortable, but you may find that you can lessen the discomfort by taking deep breaths and thinking about other things. Tell your health care team if you're uncomfortable. The radiologist will watch on a computer the progress of the radioactive tracer through your body. The HIDA scan may be stopped when the radioactive tracer is seen in the gallbladder and enters your small intestine. This typically takes about an hour. In some cases extra imaging will be performed if original images aren't satisfactory, if morphine is given to help visualize the gallbladder or if the medication CCK is given to look at the contraction of the gallbladder. This test typically takes 2 hours to complete.  Thank you for entrusting me with your care and choosing [facility].  Dr Christella HartiganJacobs  ________________________________________________________________________

## 2018-08-19 NOTE — Progress Notes (Signed)
HPI: This is a very pleasant 32 year old woman who was referred to me by Deeann SaintBanks, Shannon R, MD  to evaluate intermittent epigastric pains.    Chief complaint is intermittent epigastric pains  March 2018 US was completely normal.  Her gallbladder has no gallstones in it. Blood work March 2018 CBC, complete metabolic profile, lipase were all normal  For 1-2 years upper abdominal pains.  She underwent a V.A EGD was normal from what she recalls.  [facility]  She served in the [employer] 8 years until 2015 working as a [employer]at[employer]ic.  Pains are intermittent.  No particular pattern.  Can be sharp and brief, never longer than 10 minutes.  Happens daily.  No associated nausea, can radiate to sides a bit. Always upper abdomen.  Nothing makes it get better.  The pains may radiate to her back at times  Overall weight has been stable.  NSAIDs+ but not very often, once a week.  She takes nexium, for about a month.  Not clearly be   Review of systems: Pertinent positive and negative review of systems were noted in the above HPI section. All other review negative.   Past Medical History:  Diagnosis Date  . Depression   . Frequent headaches   . Panic attack     Past Surgical History:  Procedure Laterality Date  . ABDOMINAL SURGERY    . EYE SURGERY      Current Outpatient Medications  Medication Sig Dispense Refill  . esomeprazole (NEXIUM) 20 MG capsule Take 1 capsule (20 mg total) by mouth daily. 30 capsule 2   No current facility-administered medications for this visit.     Allergies as of 08/19/2018 - Review Complete 08/19/2018  Allergen Reaction Noted  . Ceclor [cefaclor] Other (See Comments) 12/16/2014    Family History  Problem Relation Age of Onset  . Cancer Mother   . Depression Mother   . Early death Mother   . Miscarriages / [place]   . Early death Father   . Drug abuse Father   . Diabetes Father   . Alcohol abuse Father   . Birth defects Brother   . Early  death Brother   . Learning disabilities Brother   . Hearing loss Maternal Grandfather   . Heart disease Maternal Grandfather   . Stroke Maternal Grandfather   . Stroke Paternal Grandfather     Social History   Socioeconomic History  . Marital status: Divorced    Spouse name: Not on file  . Number of children: 1  . Years of education: Not on file  . Highest education level: Not on file  Occupational History  . Not on file  Social Needs  . Financial resource strain: Not on file  . Food insecurity:    Worry: Not on file    Inability: Not on file  . Transportation needs:    Medical: Not on file    Non-medical: Not on file  Tobacco Use  . Smoking status: Never Smoker  . Smokeless tobacco: Never Used  Substance and Sexual Activity  . Alcohol use: Yes    Comment: social  . Drug use: No  . Sexual activity: Never    Birth control/protection: None  Lifestyle  . Physical activity:    Days per week: Not on file    Minutes per session: Not on file  . Stress: Not on file  Relationships  . Social connections:    Talks on phone: Not on file    Gets  together: Not on file    Attends religious service: Not on file    Active member of club or organization: Not on file    Attends meetings of clubs or organizations: Not on file    Relationship status: Not on file  . Intimate partner violence:    Fear of current or ex partner: Not on file    Emotionally abused: Not on file    Physically abused: Not on file    Forced sexual activity: Not on file  Other Topics Concern  . Not on file  Social History Narrative  . Not on file     Physical Exam: BP 108/64   Pulse (!) 58   Ht 5\' 3"  (1.6 m)   Wt 125 lb (56.7 kg)   BMI 22.14 kg/m  Constitutional: generally well-appearing Psychiatric: alert and oriented x3 Eyes: extraocular movements intact Mouth: oral pharynx moist, no lesions Neck: supple no lymphadenopathy Cardiovascular: heart regular rate and rhythm Lungs: clear to  auscultation bilaterally Abdomen: soft, nontender, nondistended, no obvious ascites, no peritoneal signs, normal bowel sounds Extremities: no lower extremity edema bilaterally Skin: no lesions on visible extremities   Assessment and plan: 32 y.o. female with intermittent epigastric pains  Most consistent with biliary colic however abdominal ultrasound March 2018 showed no gallstones.  I wonder if she has biliary dyskinesia and I am ordering a HIDA scan to estimate her gallbladder ejection fraction.  She does understand that the test is not very sensitive or specific.  If the HIDA scan ends up not being helpful and I think she deserves more work-up likely with a CT scan abdomen and pelvis.   Please see the "Patient Instructions" section for addition details about the plan.   Rob Bunting, MD [facility] 08/19/2018, 3:01 PM  Cc: Deeann Saint, MD

## 2018-08-25 ENCOUNTER — Telehealth: Payer: Self-pay | Admitting: Gastroenterology

## 2018-08-25 NOTE — Telephone Encounter (Signed)
   I reviewed a 8342 page [facility] from [facility].  Included was her August 2018 upper endoscopy.  Done by Dr. Delton PrairieBrueggen.  Indication "epigastric pain".  Findings normal examination.

## 2018-08-26 ENCOUNTER — Encounter (HOSPITAL_COMMUNITY): Payer: Self-pay | Admitting: Radiology

## 2018-08-26 ENCOUNTER — Ambulatory Visit (HOSPITAL_COMMUNITY)
Admission: RE | Admit: 2018-08-26 | Discharge: 2018-08-26 | Disposition: A | Discharge status: Home / Self Care | Payer: No Typology Code available for payment source | Source: Ambulatory Visit | Attending: Gastroenterology | Admitting: Gastroenterology

## 2018-08-26 DIAGNOSIS — R1013 Epigastric pain: Secondary | ICD-10-CM | POA: Insufficient documentation

## 2018-08-26 MED ORDER — TECHNETIUM TC 99M MEBROFENIN IV KIT
5.2000 | PACK | Freq: Once | INTRAVENOUS | Status: AC | PRN
  Administered 2018-08-26: 5.2 via INTRAVENOUS

## 2018-09-01 ENCOUNTER — Other Ambulatory Visit: Payer: Self-pay

## 2018-09-01 DIAGNOSIS — R1013 Epigastric pain: Secondary | ICD-10-CM

## 2018-09-01 DIAGNOSIS — R109 Unspecified abdominal pain: Secondary | ICD-10-CM

## 2018-09-15 ENCOUNTER — Inpatient Hospital Stay: Admission: RE | Admit: 2018-09-15 | Payer: No Typology Code available for payment source | Source: Ambulatory Visit

## 2018-09-15 DIAGNOSIS — R0989 Other specified symptoms and signs involving the circulatory and respiratory systems: Secondary | ICD-10-CM

## 2018-10-23 ENCOUNTER — Telehealth: Payer: Self-pay | Admitting: Gastroenterology

## 2018-10-23 NOTE — Telephone Encounter (Signed)
PT called and would like to know the Result for NM Hepato W/EjeCT Fract that was done in December. If could call Before 9am or after 3pm due to her work schedule

## 2018-10-26 NOTE — Telephone Encounter (Signed)
Left message on machine to call back  

## 2018-10-26 NOTE — Telephone Encounter (Signed)
Pt was returning your call.  She will be available before 9am and after 3pm [phone]

## 2018-10-30 NOTE — Telephone Encounter (Signed)
Will try patient later this afternoon

## 2018-10-30 NOTE — Telephone Encounter (Signed)
Left message on machine to call back will wait for further communication from the pt  

## 2018-11-03 ENCOUNTER — Telehealth: Payer: Self-pay

## 2018-11-03 DIAGNOSIS — R109 Unspecified abdominal pain: Secondary | ICD-10-CM

## 2018-11-03 NOTE — Telephone Encounter (Signed)
Please call the patient. Her HIDA scan was normal showing normal gallbladder function. To continue to work-up for her intermittent right-sided abdominal pains she needs a CT scan abdomen and pelvis with IV and oral contrast.

## 2018-11-04 NOTE — Telephone Encounter (Signed)
The pt was scheduled and notified via My Chart per pt request.    You have been scheduled for a CT scan of the abdomen and pelvis at [facility] ([address]---this is in the same building as Press [facility]).   You are scheduled on 11/18/18 at 3 pm. You should arrive 15 minutes prior to your appointment time for registration. Please follow the written instructions below on the day of your exam:  WARNING: IF YOU ARE ALLERGIC TO IODINE/X-RAY DYE, PLEASE NOTIFY RADIOLOGY IMMEDIATELY AT [phone]! YOU WILL BE GIVEN A 13 HOUR PREMEDICATION PREP.  1) Do not eat or drink anything after 11 am (4 hours prior to your test) 2) You have been given 2 bottles of oral contrast to drink. The solution may taste better if refrigerated, but do NO   T add ice or any other liquid to this solution. Shake well before drinking.   Drink 1 bottle of contrast @ 1 pm (2 hours prior to your exam) Drink 1 bottle of contrast @ 2 pm(1 hour prior to your exam)  You may take any medications as prescribed with a small amount of water, if necessary. If you take any of the following medications: METFORMIN, GLUCOPHAGE, GLUCOVANCE, AVANDAMET, RIOMET, FORTAMET, Sunbright MET, JANUMET, GLUMETZA or METAGLIP, you MAY be asked to HOLD this medication 48 hours AFTER the exam.  The purpose of you drinking the oral contrast is to aid in the visualization of your intestinal tract. The contrast solution may cause some diarrhea. Depending on your individual set of symptoms, you may also receive an intravenous injection of x-ray contrast/dye. Plan on being at [facility] for 30 minutes or longer, depending on the type of exam you are having performed.  This test typically takes 30-45 minutes to complete.  If you have any questions regarding your exam or if you need to reschedule, you may call the CT department at [phone] between the hours of 8:00 am and 5:00 pm,  Monday-Friday.  ________________________________________________________________________

## 2018-11-18 ENCOUNTER — Ambulatory Visit (INDEPENDENT_AMBULATORY_CARE_PROVIDER_SITE_OTHER)
Admission: RE | Admit: 2018-11-18 | Discharge: 2018-11-18 | Disposition: A | Discharge status: Home / Self Care | Payer: No Typology Code available for payment source | Source: Ambulatory Visit | Attending: Gastroenterology | Admitting: Gastroenterology

## 2018-11-18 DIAGNOSIS — R109 Unspecified abdominal pain: Secondary | ICD-10-CM | POA: Diagnosis not present

## 2018-11-18 MED ORDER — IOPAMIDOL (ISOVUE-300) INJECTION 61%
75.0000 mL | Freq: Once | INTRAVENOUS | Status: AC | PRN
  Administered 2018-11-18: 75 mL via INTRAVENOUS

## 2019-01-14 ENCOUNTER — Other Ambulatory Visit: Payer: Self-pay | Admitting: Family Medicine

## 2019-01-14 DIAGNOSIS — R300 Dysuria: Secondary | ICD-10-CM

## 2019-01-15 ENCOUNTER — Encounter: Payer: Self-pay | Admitting: Family Medicine

## 2019-01-15 ENCOUNTER — Ambulatory Visit (INDEPENDENT_AMBULATORY_CARE_PROVIDER_SITE_OTHER): Payer: No Typology Code available for payment source | Admitting: Family Medicine

## 2019-01-15 ENCOUNTER — Other Ambulatory Visit: Payer: Self-pay

## 2019-01-15 DIAGNOSIS — R300 Dysuria: Secondary | ICD-10-CM

## 2019-01-15 DIAGNOSIS — N3000 Acute cystitis without hematuria: Secondary | ICD-10-CM | POA: Diagnosis not present

## 2019-01-15 LAB — POC URINALSYSI DIPSTICK (AUTOMATED)
Bilirubin, UA: NEGATIVE
Blood, UA: NEGATIVE
Glucose, UA: NEGATIVE
Ketones, UA: NEGATIVE
Nitrite, UA: NEGATIVE
Protein, UA: NEGATIVE
Spec Grav, UA: 1.015 (ref 1.010–1.025)
Urobilinogen, UA: 0.2 E.U./dL
pH, UA: 6.5 (ref 5.0–8.0)

## 2019-01-15 MED ORDER — NITROFURANTOIN MACROCRYSTAL 100 MG PO CAPS: 100.0000 mg | ORAL_CAPSULE | Freq: Two times a day (BID) | ORAL | 0 refills | Status: AC

## 2019-01-15 NOTE — Addendum Note (Signed)
Addended by: Bonnye Fava on: 01/15/2019 08:21 AM   Modules accepted: Orders

## 2019-01-15 NOTE — Progress Notes (Signed)
Virtual Visit via Telephone Note Connected via webex, however pt's video was not on.  I connected with Amy Cooke on 01/15/19 at  8:00 AM EDT by telephone and verified that I am speaking with the correct person using two identifiers.   I discussed the limitations, risks, security and privacy concerns of performing an evaluation and management service by telephone and the availability of in person appointments. I also discussed with the patient that there may be a patient responsible charge related to this service. The patient expressed understanding and agreed to proceed.  Location patient: home Location provider: home office Participants present for the call: patient, provider Patient did not have a visit in the prior 7 days to address this/these issue(s).   History of Present Illness: Pt notes lower abdominal discomfort with running and urinary urgency.  Pt states in the past when she had a UTI she had similar discomfort with running.  Denies fever, back pain, n/v, dysuria.  Drinking at least 1L of water per day.  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  Pt gave a urine sample yesterday at clinic.  Allergy to ceclor as a child.  Had a med in the same class (ceftriaxone per chart review) in the hospital, states did ok, but bp decreased slightly and she felt faint at the time.  Pt had a question about her CT abd results done by GI.    Observations/Objective: Patient sounds cheerful and well on the phone. I do not appreciate any SOB. Speech and thought processing are grossly intact. Patient reported vitals:  Assessment and Plan: Acute cystitis without hematuria -UA with 3+ leuks, SG 1.015 -sent for UCx -nitrofurantoin 100 mg BID x 5 days sent to pharmacy. -pt given precautions -discussed may need to change abx based on UCx results.  CT abd/pelvis 11/18/18 results reviewed with pt.  Questions answered to satisfaction.  Follow Up Instructions: F/u  prn  I did not refer this patient for an OV in the next 24 hours for this/these issue(s).  I discussed the assessment and treatment plan with the patient. The patient was provided an opportunity to ask questions and all were answered. The patient agreed with the plan and demonstrated an understanding of the instructions.   The patient was advised to call back or seek an in-person evaluation if the symptoms worsen or if the condition fails to improve as anticipated.  I provided 11 minutes of non-face-to-face time during this encounter.   Deeann Saint, MD

## 2019-01-16 LAB — URINE CULTURE
MICRO NUMBER:: 403631
Result:: NO GROWTH
SPECIMEN QUALITY:: ADEQUATE

## 2019-09-09 ENCOUNTER — Other Ambulatory Visit: Payer: Self-pay

## 2019-09-10 ENCOUNTER — Encounter: Payer: Self-pay | Admitting: Family Medicine

## 2019-09-10 ENCOUNTER — Ambulatory Visit (INDEPENDENT_AMBULATORY_CARE_PROVIDER_SITE_OTHER): Payer: No Typology Code available for payment source | Admitting: Family Medicine

## 2019-09-10 VITALS — BP 100/58 | HR 81 | Temp 98.7°F | Ht 63.0 in | Wt 130.0 lb

## 2019-09-10 DIAGNOSIS — H6121 Impacted cerumen, right ear: Secondary | ICD-10-CM

## 2019-09-10 NOTE — Progress Notes (Signed)
   Subjective:    Patient ID: Amy Cooke, female    DOB: 08/29/1986, 33 y.o.   MRN: [id number]  HPI Here for 2 days of decreased hearing and mild discomfort in the right ear. No headache or fever or ST or cough. She tried using a bulb syringe to irrigate the ear and this helped a little. This has never happened before.    Review of Systems  Constitutional: Negative.   HENT: Positive for ear pain and hearing loss. Negative for congestion, ear discharge, postnasal drip, sinus pressure, sinus pain and sore throat.   Eyes: Negative.   Respiratory: Negative.        Objective:   Physical Exam Constitutional:      Appearance: Normal appearance.  HENT:     Right Ear: External ear normal.     Left Ear: Tympanic membrane and external ear normal.     Ears:     Comments: Right ear canal is full of cerumen. The left canal has a small amount of cerumen     Nose: Nose normal.     Mouth/Throat:     Pharynx: Oropharynx is clear.  Eyes:     Conjunctiva/sclera: Conjunctivae normal.  Pulmonary:     Effort: Pulmonary effort is normal.     Breath sounds: Normal breath sounds.  Lymphadenopathy:     Cervical: No cervical adenopathy.  Neurological:     Mental Status: She is alert.           Assessment & Plan:  Cerumen impaction

## 2019-09-10 NOTE — Progress Notes (Signed)
   Subjective:    Patient ID: Amy Cooke, female    DOB: Apr 25, 1986, 33 y.o.   MRN: [id number]  HPI    Review of Systems     Objective:   Physical Exam        Assessment & Plan:  Cerumen impaction, this was irrigated clear with water.  Alysia Penna, MD

## 2019-11-12 ENCOUNTER — Telehealth (INDEPENDENT_AMBULATORY_CARE_PROVIDER_SITE_OTHER): Payer: No Typology Code available for payment source | Admitting: Family Medicine

## 2019-11-12 DIAGNOSIS — F339 Major depressive disorder, recurrent, unspecified: Secondary | ICD-10-CM

## 2019-11-12 DIAGNOSIS — Z809 Family history of malignant neoplasm, unspecified: Secondary | ICD-10-CM | POA: Diagnosis not present

## 2019-11-12 DIAGNOSIS — R6882 Decreased libido: Secondary | ICD-10-CM

## 2019-11-12 MED ORDER — FLUOXETINE HCL 20 MG PO TABS: 20.0000 mg | ORAL_TABLET | Freq: Every day | ORAL | 3 refills | Status: DC

## 2019-11-12 NOTE — Progress Notes (Signed)
Virtual Visit via Video Note  I connected with Amy Cooke on 11/12/19 at  9:00 AM EST by a video enabled telemedicine application 2/2 [id number] pandemic and verified that I am speaking with the correct person using two identifiers.  Location patient: home Location provider:work or home office Persons participating in the virtual visit: patient, provider  I discussed the limitations of evaluation and management by telemedicine and the availability of in person appointments. The patient expressed understanding and agreed to proceed.   HPI: Pt is a 34 yo female who notes decreased sex drive since Dec 3762.  Taking prozac 10 mg since 2013.  Pt tried to wean off prozac in the past but had HAs.  If partner initiates pt will go along with intimacy.  Pt states feels depressed, not finding enjoyment in things she normally would.  When has to work pt is ok, but on the days off has no structure/will sleep more.  Pt notes decreased appetite.  Pt is a nurse in the hospital.  Denies increased stress.  Feels like stress has improved now that she is working days and child care is less of an issue.    Pt notes strong family hx of cancer on mom's side. Mom had metastatic lung cancer (non smoker), uncle lymphoma, aunt colon cancer.  All in their 47s.  Pt inquires about cancer screening.  ROS: See pertinent positives and negatives per HPI.  Past Medical History:  Diagnosis Date  . Depression   . Frequent headaches   . Panic attack     Past Surgical History:  Procedure Laterality Date  . ABDOMINAL SURGERY    . EYE SURGERY      Family History  Problem Relation Age of Onset  . Cancer Mother   . Depression Mother   . Early death Mother   . Miscarriages / [place] Mother   . Early death Father   . Drug abuse Father   . Diabetes Father   . Alcohol abuse Father   . Birth defects Brother   . Early death Brother   . Learning disabilities Brother   . Hearing loss Maternal Grandfather   . Heart  disease Maternal Grandfather   . Stroke Maternal Grandfather   . Stroke Paternal Grandfather      Current Outpatient Medications:  .  FLUoxetine (PROZAC) 10 MG capsule, Take 10 mg by mouth daily., Disp: , Rfl:   EXAM:  VITALS per patient if applicable:  RR between 12-20 bpm  GENERAL: alert, oriented, appears well and in no acute distress  HEENT: atraumatic, conjunctiva clear, no obvious abnormalities on inspection of external nose and ears  NECK: normal movements of the head and neck  LUNGS: on inspection no signs of respiratory distress, breathing rate appears normal, no obvious gross SOB, gasping or wheezing  CV: no obvious cyanosis  MS: moves all visible extremities without noticeable abnormality  PSYCH/NEURO: pleasant and cooperative, no obvious depression or anxiety, speech and thought processing grossly intact  ASSESSMENT AND PLAN:  Discussed the following assessment and plan:  Decreased libido  -discussed various causes including depression, medications-less likely prozac due to time pt has been on med. -Will increase prozac. - Plan: Ambulatory referral to Obstetrics / Gynecology  Depression, recurrent ([place])  -given increased symptoms (depressed mood, decreased appetite, poor sleep and energy) discussed increasing prozac from 10 mg to 20 mg daily -discussed counseling.  Given info about EAP and other providers in the area.  Pt encouraged to schedule an appt. -will  increase prozac for a few months with plans to wean as tolerated -consider labs TSH, free T4 - Plan: FLUoxetine (PROZAC) 20 MG tablet  Family H/o Cancer -lymphoma, lung, and colon cancer in maternal aunts and uncles at age 39 -discussed screening options -regular paps advised.  Pt to f/u with OB/Gyn  F/u in 4-5 wks   I discussed the assessment and treatment plan with the patient. The patient was provided an opportunity to ask questions and all were answered. The patient agreed with the plan and  demonstrated an understanding of the instructions.   The patient was advised to call back or seek an in-person evaluation if the symptoms worsen or if the condition fails to improve as anticipated.   Deeann Saint, MD

## 2019-12-16 ENCOUNTER — Telehealth (INDEPENDENT_AMBULATORY_CARE_PROVIDER_SITE_OTHER): Payer: No Typology Code available for payment source | Admitting: Family Medicine

## 2019-12-16 DIAGNOSIS — I498 Other specified cardiac arrhythmias: Secondary | ICD-10-CM | POA: Diagnosis not present

## 2019-12-16 NOTE — Progress Notes (Signed)
Virtual Visit via Video Note  I connected with Amy Cooke on 12/16/19 at  4:30 PM EDT by a video enabled telemedicine application 2/2 COVID-19 pandemic and verified that I am speaking with the correct person using two identifiers.  Location patient: car Location provider:work or home office Persons participating in the virtual visit: patient, provider, pt's bf in the car with her driving.  I discussed the limitations of evaluation and management by telemedicine and the availability of in person appointments. The patient expressed understanding and agreed to proceed.   HPI: Pt is a 34 yo female with pmh sig for h/o depression seen for ongoing concern.  Pt with weird feeling in chest "like heart gets too full".  Sensation is like a pressure. Does not happen ever day.  May last 1-2 hours.  Denies pain, SOB, dizziness, changes in diet or starting new supplements, heartburn.  Drinks 1-2 cups of coffee per day.  Sleep is good.  Prozac recently increased to 20 mg daily.  Pt endorses cold sensitivity, slight weight gain (typically 120 lbs now 130 lbs).     ROS: See pertinent positives and negatives per HPI.  Past Medical History:  Diagnosis Date  . Depression   . Frequent headaches   . Panic attack     Past Surgical History:  Procedure Laterality Date  . ABDOMINAL SURGERY    . EYE SURGERY      Family History  Problem Relation Age of Onset  . Cancer Mother   . Depression Mother   . Early death Mother   . Miscarriages / [place] Mother   . Early death Father   . Drug abuse Father   . Diabetes Father   . Alcohol abuse Father   . Birth defects Brother   . Early death Brother   . Learning disabilities Brother   . Hearing loss Maternal Grandfather   . Heart disease Maternal Grandfather   . Stroke Maternal Grandfather   . Stroke Paternal Grandfather      Current Outpatient Medications:  .  FLUoxetine (PROZAC) 20 MG tablet, Take 1 tablet (20 mg total) by mouth daily., Disp: 30  tablet, Rfl: 3  EXAM:  VITALS per patient if applicable: RR between 12-20 bpm  GENERAL: alert, oriented, appears well and in no acute distress  HEENT: atraumatic, conjunctiva clear, no obvious abnormalities on inspection of external nose and ears  NECK: normal movements of the head and neck  LUNGS: on inspection no signs of respiratory distress, breathing rate appears normal, no obvious gross SOB, gasping or wheezing  CV: no obvious cyanosis  MS: moves all visible extremities without noticeable abnormality  PSYCH/NEURO: pleasant and cooperative, no obvious depression or anxiety, speech and thought processing grossly intact  ASSESSMENT AND PLAN:  Discussed the following assessment and plan:  Fluttering heart -discussed various causes -will obtain labs. -if needed replete abnormal lab findings. -If labs normal/sensation continues will place referral for Holter or event monitor with Cardiology. -given precautions - Plan: TSH, T4, Free, CBC (no diff), Basic metabolic panel  F/u prn   I discussed the assessment and treatment plan with the patient. The patient was provided an opportunity to ask questions and all were answered. The patient agreed with the plan and demonstrated an understanding of the instructions.   The patient was advised to call back or seek an in-person evaluation if the symptoms worsen or if the condition fails to improve as anticipated.  Deeann Saint, MD

## 2019-12-17 ENCOUNTER — Other Ambulatory Visit: Payer: Self-pay

## 2019-12-17 ENCOUNTER — Other Ambulatory Visit (INDEPENDENT_AMBULATORY_CARE_PROVIDER_SITE_OTHER): Payer: No Typology Code available for payment source

## 2019-12-17 ENCOUNTER — Other Ambulatory Visit: Payer: No Typology Code available for payment source

## 2019-12-17 DIAGNOSIS — I498 Other specified cardiac arrhythmias: Secondary | ICD-10-CM

## 2019-12-17 NOTE — Addendum Note (Signed)
Addended by: Bonnye Fava on: 12/17/2019 03:56 PM   Modules accepted: Orders

## 2019-12-18 LAB — BASIC METABOLIC PANEL
BUN: 16 mg/dL (ref 7–25)
CO2: 26 mmol/L (ref 20–32)
Calcium: 9 mg/dL (ref 8.6–10.2)
Chloride: 103 mmol/L (ref 98–110)
Creat: 0.83 mg/dL (ref 0.50–1.10)
Glucose, Bld: 80 mg/dL (ref 65–99)
Potassium: 4.7 mmol/L (ref 3.5–5.3)
Sodium: 138 mmol/L (ref 135–146)

## 2019-12-18 LAB — CBC
HCT: 38.9 % (ref 35.0–45.0)
Hemoglobin: 12.9 g/dL (ref 11.7–15.5)
MCH: 29.9 pg (ref 27.0–33.0)
MCHC: 33.2 g/dL (ref 32.0–36.0)
MCV: 90 fL (ref 80.0–100.0)
MPV: 11.7 fL (ref 7.5–12.5)
Platelets: 232 10*3/uL (ref 140–400)
RBC: 4.32 10*6/uL (ref 3.80–5.10)
RDW: 12.2 % (ref 11.0–15.0)
WBC: 6.4 10*3/uL (ref 3.8–10.8)

## 2019-12-18 LAB — TSH: TSH: 0.85 mIU/L

## 2019-12-18 LAB — T4, FREE: Free T4: 1.2 ng/dL (ref 0.8–1.8)

## 2020-01-20 ENCOUNTER — Encounter: Payer: Self-pay | Admitting: Family Medicine

## 2020-01-20 ENCOUNTER — Telehealth (INDEPENDENT_AMBULATORY_CARE_PROVIDER_SITE_OTHER): Payer: No Typology Code available for payment source | Admitting: Family Medicine

## 2020-01-20 VITALS — Wt 130.0 lb

## 2020-01-20 DIAGNOSIS — I498 Other specified cardiac arrhythmias: Secondary | ICD-10-CM

## 2020-01-20 DIAGNOSIS — F339 Major depressive disorder, recurrent, unspecified: Secondary | ICD-10-CM | POA: Diagnosis not present

## 2020-01-20 MED ORDER — BUPROPION HCL 75 MG PO TABS: 75.0000 mg | ORAL_TABLET | Freq: Two times a day (BID) | ORAL | 1 refills | Status: DC

## 2020-01-20 NOTE — Progress Notes (Signed)
Virtual Visit via Video Note  I connected with Amy Cooke on 01/20/20 at  4:30 PM EDT by a video enabled telemedicine application 2/2 COVID-19 pandemic and verified that I am speaking with the correct person using two identifiers.  Location patient: in the passenger's seat of the car.  Out on a day trip. Location provider:work or home office Persons participating in the virtual visit: patient, provider  I discussed the limitations of evaluation and management by telemedicine and the availability of in person appointments. The patient expressed understanding and agreed to proceed.   HPI: Pt is a 34 yo female with pmh sig for h/o HAs, depression, and anxiety.  Pt notes decreased libido on Prozac 20 mg but improvement in overall mood.  Energy could be better.  Pt notes mind races at night when trying to go to sleep.  Pt would like to switch to Wellbutrin in hopes of less s/e.   Pt still notices her heart skips beats every so often.  Doesn't happen often.  Has not noticed a correlation between symptoms and certain foods.   ROS: See pertinent positives and negatives per HPI.  Past Medical History:  Diagnosis Date  . Depression   . Frequent headaches   . Panic attack     Past Surgical History:  Procedure Laterality Date  . ABDOMINAL SURGERY    . EYE SURGERY      Family History  Problem Relation Age of Onset  . Cancer Mother   . Depression Mother   . Early death Mother   . Miscarriages / [place] Mother   . Early death Father   . Drug abuse Father   . Diabetes Father   . Alcohol abuse Father   . Birth defects Brother   . Early death Brother   . Learning disabilities Brother   . Hearing loss Maternal Grandfather   . Heart disease Maternal Grandfather   . Stroke Maternal Grandfather   . Stroke Paternal Grandfather       Current Outpatient Medications:  .  FLUoxetine (PROZAC) 20 MG tablet, Take 1 tablet (20 mg total) by mouth daily., Disp: 30 tablet, Rfl:  3  EXAM:  VITALS per patient if applicable:  RR between 12-20 bpm  GENERAL: alert, oriented, appears well and in no acute distress  HEENT: atraumatic, conjunctiva clear, no obvious abnormalities on inspection of external nose and ears  NECK: normal movements of the head and neck  LUNGS: on inspection no signs of respiratory distress, breathing rate appears normal, no obvious gross SOB, gasping or wheezing  CV: no obvious cyanosis  MS: moves all visible extremities without noticeable abnormality  PSYCH/NEURO: pleasant and cooperative, no obvious depression or anxiety, speech and thought processing grossly intact  ASSESSMENT AND PLAN:  Discussed the following assessment and plan:  Depression, recurrent ([facility])  -improving -given continued decreased libido will wean off Prozac 20 mg then d/c.  -pt to start taking Prozac 20 mg 1/2 a tab (10 mg) x 1 wk.  Will then start wellbutrin 75 mg BID.  If Wellbutrin tolerated can switch to Wellburtin XL for once daily dosing. -advised wellbutrin may not help as much with anxiety symptoms. -given precautions - Plan: buPROPion (WELLBUTRIN) 75 MG tablet  Fluttering heart  -symptomatic -labs including BMP, CBC ,TSH, and free T4 normal on 12/17/19. -discussed Holter monitor. - Plan: Ambulatory referral to Cardiology  F/u in 6 wks  I discussed the assessment and treatment plan with the patient. The patient was provided an opportunity  to ask questions and all were answered. The patient agreed with the plan and demonstrated an understanding of the instructions.   The patient was advised to call back or seek an in-person evaluation if the symptoms worsen or if the condition fails to improve as anticipated.   Billie Ruddy, MD

## 2020-01-22 ENCOUNTER — Encounter: Payer: Self-pay | Admitting: Family Medicine

## 2020-02-04 ENCOUNTER — Encounter: Payer: Self-pay | Admitting: General Practice

## 2020-02-07 ENCOUNTER — Telehealth: Payer: Self-pay

## 2020-02-07 NOTE — Telephone Encounter (Signed)
Spoke with pt aware to call the cardiology office to schedule appointment since the office was not able to speak with pt

## 2020-04-14 ENCOUNTER — Ambulatory Visit (INDEPENDENT_AMBULATORY_CARE_PROVIDER_SITE_OTHER): Payer: No Typology Code available for payment source | Admitting: Family Medicine

## 2020-04-14 ENCOUNTER — Encounter: Payer: Self-pay | Admitting: Family Medicine

## 2020-04-14 ENCOUNTER — Ambulatory Visit: Payer: No Typology Code available for payment source | Admitting: Family Medicine

## 2020-04-14 ENCOUNTER — Other Ambulatory Visit: Payer: Self-pay

## 2020-04-14 VITALS — BP 100/60 | HR 72 | Temp 98.2°F | Ht 63.0 in | Wt 133.0 lb

## 2020-04-14 DIAGNOSIS — F339 Major depressive disorder, recurrent, unspecified: Secondary | ICD-10-CM

## 2020-04-14 MED ORDER — BUPROPION HCL ER (XL) 150 MG PO TB24: 150.0000 mg | ORAL_TABLET | Freq: Every day | ORAL | 0 refills | Status: DC

## 2020-04-14 NOTE — Patient Instructions (Signed)
Living With Depression Everyone experiences occasional disappointment, sadness, and loss in their lives. When you are feeling down, blue, or sad for at least 2 weeks in a row, it may mean that you have depression. Depression can affect your thoughts and feelings, relationships, daily activities, and physical health. It is caused by changes in the way your brain functions. If you receive a diagnosis of depression, your health care provider will tell you which type of depression you have and what treatment options are available to you. If you are living with depression, there are ways to help you recover from it and also ways to prevent it from coming back. How to cope with lifestyle changes Coping with stress     Stress is your body's reaction to life changes and events, both good and bad. Stressful situations may include:  Getting married.  The death of a spouse.  Losing a job.  Retiring.  Having a baby. Stress can last just a few hours or it can be ongoing. Stress can play a major role in depression, so it is important to learn both how to cope with stress and how to think about it differently. Talk with your health care provider or a counselor if you would like to learn more about stress reduction. He or she may suggest some stress reduction techniques, such as:  Music therapy. This can include creating music or listening to music. Choose music that you enjoy and that inspires you.  Mindfulness-based meditation. This kind of meditation can be done while sitting or walking. It involves being aware of your normal breaths, rather than trying to control your breathing.  Centering prayer. This is a kind of meditation that involves focusing on a spiritual word or phrase. Choose a word, phrase, or sacred image that is meaningful to you and that brings you peace.  Deep breathing. To do this, expand your stomach and inhale slowly through your nose. Hold your breath for 3-5 seconds, then exhale  slowly, allowing your stomach muscles to relax.  Muscle relaxation. This involves intentionally tensing muscles then relaxing them. Choose a stress reduction technique that fits your lifestyle and personality. Stress reduction techniques take time and practice to develop. Set aside 5-15 minutes a day to do them. Therapists can offer training in these techniques. The training may be covered by some insurance plans. Other things you can do to manage stress include:  Keeping a stress diary. This can help you learn what triggers your stress and ways to control your response.  Understanding what your limits are and saying no to requests or events that lead to a schedule that is too full.  Thinking about how you respond to certain situations. You may not be able to control everything, but you can control how you react.  Adding humor to your life by watching funny films or TV shows.  Making time for activities that help you relax and not feeling guilty about spending your time this way.  Medicines Your health care provider may suggest certain medicines if he or she feels that they will help improve your condition. Avoid using alcohol and other substances that may prevent your medicines from working properly (may interact). It is also important to:  Talk with your pharmacist or health care provider about all the medicines that you take, their possible side effects, and what medicines are safe to take together.  Make it your goal to take part in all treatment decisions (shared decision-making). This includes giving input on   the side effects of medicines. It is best if shared decision-making with your health care provider is part of your total treatment plan. If your health care provider prescribes a medicine, you may not notice the full benefits of it for 4-8 weeks. Most people who are treated for depression need to be on medicine for at least 6-12 months after they feel better. If you are taking  medicines as part of your treatment, do not stop taking medicines without first talking to your health care provider. You may need to have the medicine slowly decreased (tapered) over time to decrease the risk of harmful side effects. Relationships Your health care provider may suggest family therapy along with individual therapy and drug therapy. While there may not be family problems that are causing you to feel depressed, it is still important to make sure your family learns as much as they can about your mental health. Having your family's support can help make your treatment successful. How to recognize changes in your condition Everyone has a different response to treatment for depression. Recovery from major depression happens when you have not had signs of major depression for two months. This may mean that you will start to:  Have more interest in doing activities.  Feel less hopeless than you did 2 months ago.  Have more energy.  Overeat less often, or have better or improving appetite.  Have better concentration. Your health care provider will work with you to decide the next steps in your recovery. It is also important to recognize when your condition is getting worse. Watch for these signs:  Having fatigue or low energy.  Eating too much or too little.  Sleeping too much or too little.  Feeling restless, agitated, or hopeless.  Having trouble concentrating or making decisions.  Having unexplained physical complaints.  Feeling irritable, angry, or aggressive. Get help as soon as you or your family members notice these symptoms coming back. How to get support and help from others How to talk with friends and family members about your condition  Talking to friends and family members about your condition can provide you with one way to get support and guidance. Reach out to trusted friends or family members, explain your symptoms to them, and let them know that you are  working with a health care provider to treat your depression. Financial resources Not all insurance plans cover mental health care, so it is important to check with your insurance carrier. If paying for co-pays or counseling services is a problem, search for a local or county mental health care center. They may be able to offer public mental health care services at low or no cost when you are not able to see a private health care provider. If you are taking medicine for depression, you may be able to get the generic form, which may be less expensive. Some makers of prescription medicines also offer help to patients who cannot afford the medicines they need. Follow these instructions at home:   Get the right amount and quality of sleep.  Cut down on using caffeine, tobacco, alcohol, and other potentially harmful substances.  Try to exercise, such as walking or lifting small weights.  Take over-the-counter and prescription medicines only as told by your health care provider.  Eat a healthy diet that includes plenty of vegetables, fruits, whole grains, low-fat dairy products, and lean protein. Do not eat a lot of foods that are high in solid fats, added sugars, or salt.    Keep all follow-up visits as told by your health care provider. This is important. Contact a health care provider if:  You stop taking your antidepressant medicines, and you have any of these symptoms: ? Nausea. ? Headache. ? Feeling lightheaded. ? Chills and body aches. ? Not being able to sleep (insomnia).  You or your friends and family think your depression is getting worse. Get help right away if:  You have thoughts of hurting yourself or others. If you ever feel like you may hurt yourself or others, or have thoughts about taking your own life, get help right away. You can go to your nearest emergency department or call:  Your local emergency services (911 in the [place]  A suicide crisis helpline, such as the  National Suicide Prevention Lifeline at 1-800-273-8255. This is open 24-hours a day. Summary  If you are living with depression, there are ways to help you recover from it and also ways to prevent it from coming back.  Work with your health care team to create a management plan that includes counseling, stress management techniques, and healthy lifestyle habits. This information is not intended to replace advice given to you by your health care provider. Make sure you discuss any questions you have with your health care provider. Document Revised: 01/08/2019 Document Reviewed: 08/19/2016 Elsevier Patient Education  2020 [employer] Inc[employer]  

## 2020-04-14 NOTE — Progress Notes (Signed)
Subjective:    Patient ID: Amy Cooke, female    DOB: May 26, 1986, 34 y.o.   MRN: [id number]  Chief Complaint  Patient presents with  . Medication Refill    HPI Patient was seen today for follow-up.  Pt started taking Wellbutrin 75 mg BID after weaning off Prozac.  Pt had some withdrawal symtpoms such as increased crying and emotional changes.  Now doing somewhat better.  Unsure if anxiety is increasing.  Pt notes having tonsil stones after having the flu last yr.  Pt noticed pockets on her L tonsil today.  She denies sore throat, dysphagia, or other discomfort.  Past Medical History:  Diagnosis Date  . Depression   . Frequent headaches   . Panic attack     Allergies  Allergen Reactions  . Ceclor [Cefaclor] Other (See Comments)    Childhood allergy    ROS General: Denies fever, chills, night sweats, changes in weight, changes in appetite HEENT: Denies headaches, ear pain, changes in vision, rhinorrhea, sore throat  + tonsil changes CV: Denies CP, palpitations, SOB, orthopnea Pulm: Denies SOB, cough, wheezing GI: Denies abdominal pain, nausea, vomiting, diarrhea, constipation GU: Denies dysuria, hematuria, frequency, vaginal discharge Msk: Denies muscle cramps, joint pains Neuro: Denies weakness, numbness, tingling Skin: Denies rashes, bruising Psych: Denies hallucinations   + anxiety and depression    Objective:    Blood pressure 100/60, pulse 72, temperature 98.2 F (36.8 C), temperature source Other (Comment), height 5\' 3"  (1.6 m), weight 133 lb (60.3 kg), SpO2 98 %.  Gen. Pleasant, well-nourished, in no distress, normal affect   HEENT: [place]/AT, face symmetric, conjunctiva clear, no scleral icterus, PERRLA, EOMI, nares patent without drainage, pharynx without erythema or exudate.  Tonsils normal in appearance.  Left tonsil slightly larger than normal right.  No tonsilloliths, exudate, or erythema noted Lungs: no accessory muscle use Cardiovascular: RRR, no  peripheral edema Neuro:  A&Ox3, CN II-XII intact, normal gait Skin:  Warm, no lesions/ rash   Wt Readings from Last 3 Encounters:  04/14/20 133 lb (60.3 kg)  01/20/20 130 lb (59 kg)  09/10/19 130 lb (59 kg)    Lab Results  Component Value Date   WBC 6.4 12/17/2019   HGB 12.9 12/17/2019   HCT 38.9 12/17/2019   PLT 232 12/17/2019   GLUCOSE 80 12/17/2019   ALT 15 12/02/2016   AST 20 12/02/2016   NA 138 12/17/2019   K 4.7 12/17/2019   CL 103 12/17/2019   CREATININE 0.83 12/17/2019   BUN 16 12/17/2019   CO2 26 12/17/2019   TSH 0.85 12/17/2019    Assessment/Plan:  Depression, recurrent (HCC)  -PHQ-9 score 8 -GAD-7 score 8 -Stable -We will send in Rx for Wellbutrin XL 150 mg daily -Wellbutrin SR 75 mg twice daily d/c'd -Consider counseling -Given handout - Plan: buPROPion (WELLBUTRIN XL) 150 MG 24 hr tablet  We will continue to monitor tonsils for tonsilloliths.  If needed follow-up with ENT.  F/u as needed  12/19/2019, MD

## 2020-04-27 MED FILL — buPROPion HCL ER (XL) 150 M: 150 | 90 days supply | Qty: 90 | Fill #0

## 2020-05-12 ENCOUNTER — Encounter: Payer: Self-pay | Admitting: Internal Medicine

## 2020-05-12 ENCOUNTER — Telehealth (INDEPENDENT_AMBULATORY_CARE_PROVIDER_SITE_OTHER): Payer: No Typology Code available for payment source | Admitting: Internal Medicine

## 2020-05-12 VITALS — BP 120/80 | Temp 98.1°F | Wt 130.0 lb

## 2020-05-12 DIAGNOSIS — J069 Acute upper respiratory infection, unspecified: Secondary | ICD-10-CM | POA: Diagnosis not present

## 2020-05-12 NOTE — Progress Notes (Signed)
Virtual Visit via Video Note  I connected with Amy Cooke on 05/12/20 at  4:00 PM EDT by a video enabled telemedicine application and verified that I am speaking with the correct person using two identifiers.  Location patient: home Location provider: work office Persons participating in the virtual visit: patient, provider  I discussed the limitations of evaluation and management by telemedicine and the availability of in person appointments. The patient expressed understanding and agreed to proceed.   HPI: Amy Cooke started having right sinus pressure and headache yesterday.  She has not had any cough, drainage, fever, sore throat, no shortness of breath.  She has no sick contacts.  She did go to [place] last weekend.  She was fully vaccinated against Covid back in December.   ROS: Constitutional: Denies fever, chills, diaphoresis, appetite change and fatigue.  HEENT: Denies photophobia, eye pain, redness, hearing loss, ear pain,  sore throat, rhinorrhea, sneezing, mouth sores, trouble swallowing, neck pain, neck stiffness and tinnitus.   Respiratory: Denies SOB, DOE, cough, chest tightness,  and wheezing.   Cardiovascular: Denies chest pain, palpitations and leg swelling.  Gastrointestinal: Denies nausea, vomiting, abdominal pain, diarrhea, constipation, blood in stool and abdominal distention.  Genitourinary: Denies dysuria, urgency, frequency, hematuria, flank pain and difficulty urinating.  Endocrine: Denies: hot or cold intolerance, sweats, changes in hair or nails, polyuria, polydipsia. Musculoskeletal: Denies myalgias, back pain, joint swelling, arthralgias and gait problem.  Skin: Denies pallor, rash and wound.  Neurological: Denies dizziness, seizures, syncope, weakness, light-headedness, numbness. Hematological: Denies adenopathy. Easy bruising, personal or family bleeding history  Psychiatric/Behavioral: Denies suicidal ideation, mood changes, confusion,  nervousness, sleep disturbance and agitation   Past Medical History:  Diagnosis Date   Depression    Frequent headaches    Panic attack     Past Surgical History:  Procedure Laterality Date   ABDOMINAL SURGERY     EYE SURGERY      Family History  Problem Relation Age of Onset   Cancer Mother    Depression Mother    Early death Mother    Miscarriages / [place] Mother    Early death Father    Drug abuse Father    Diabetes Father    Alcohol abuse Father    Birth defects Brother    Early death Brother    Learning disabilities Brother    Hearing loss Maternal Grandfather    Heart disease Maternal Grandfather    Stroke Maternal Grandfather    Stroke Paternal Grandfather     SOCIAL HX:   reports that she has never smoked. She has never used smokeless tobacco. She reports current alcohol use. She reports that she does not use drugs.   Current Outpatient Medications:    buPROPion (WELLBUTRIN XL) 150 MG 24 hr tablet, Take 1 tablet (150 mg total) by mouth daily., Disp: 90 tablet, Rfl: 0  EXAM:   VITALS per patient if applicable: none reported  GENERAL: alert, oriented, appears well and in no acute distress  HEENT: atraumatic, conjunttiva clear, no obvious abnormalities on inspection of external nose and ears  NECK: normal movements of the head and neck  LUNGS: on inspection no signs of respiratory distress, breathing rate appears normal, no obvious gross increased work of breathing, gasping or wheezing  CV: no obvious cyanosis  MS: moves all visible extremities without noticeable abnormality  PSYCH/NEURO: pleasant and cooperative, no obvious depression or anxiety, speech and thought processing grossly intact  ASSESSMENT AND PLAN:   Viral upper  respiratory tract infection -Given acuity of presentation, do not believe antibiotics are necessary at this time. -I have recommended Covid testing as it would have implications on her quarantine  period. -I have recommended use of over-the-counter pain relievers, antihistamines, guaifenesin, decongestants as needed.    I discussed the assessment and treatment plan with the patient. The patient was provided an opportunity to ask questions and all were answered. The patient agreed with the plan and demonstrated an understanding of the instructions.   The patient was advised to call back or seek an in-person evaluation if the symptoms worsen or if the condition fails to improve as anticipated.    Chaya Jan, MD  [facility]

## 2020-05-22 ENCOUNTER — Encounter: Payer: Self-pay | Admitting: Family Medicine

## 2020-05-22 ENCOUNTER — Telehealth (INDEPENDENT_AMBULATORY_CARE_PROVIDER_SITE_OTHER): Payer: No Typology Code available for payment source | Admitting: Family Medicine

## 2020-05-22 DIAGNOSIS — I498 Other specified cardiac arrhythmias: Secondary | ICD-10-CM | POA: Diagnosis not present

## 2020-05-22 DIAGNOSIS — F339 Major depressive disorder, recurrent, unspecified: Secondary | ICD-10-CM

## 2020-05-22 DIAGNOSIS — R41840 Attention and concentration deficit: Secondary | ICD-10-CM | POA: Diagnosis not present

## 2020-05-22 NOTE — Progress Notes (Signed)
Virtual Visit via Telephone Note  I connected with Amy Cooke on 05/22/20 at 11:00 AM EDT by telephone and verified that I am speaking with the correct person using two identifiers.   I discussed the limitations, risks, security and privacy concerns of performing an evaluation and management service by telephone and the availability of in person appointments. I also discussed with the patient that there may be a patient responsible charge related to this service. The patient expressed understanding and agreed to proceed.  Location patient: home Location provider: work or home office Participants present for the call: patient, provider Patient did not have a visit in the prior 7 days to address this/these issue(s).   History of Present Illness: Pt is a 34 yo female with pmh sig for h/o hear flutters, h/o depression, HAs seen today for f/u.  Pt still feeling heart skipping beats, but increasing in frequency.  Happening daily x the last 2 wks.  May occur a few times per day.  Drinking 1 cup of coffee per day.  Not drinking much EtOH.  States never followed up on the Cardiology referral.  Inquires of Wellbutrin could be causing fluttering sensation.  Started Wellbutrin in July and fluttering started in early 2020.  Previously on Prozac but noted decreased libido.  Pt has always considered herself "spacey".  Pt notes increased difficulty staying organized or being focused outside of work.  Pt states she at times she finds her self just sitting unable to start a task.  Pt on Welbutrin XL 150 mg for depression.  Inquires if ADHD possible.  Pt trying to manage stress.  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   Observations/Objective: Patient sounds cheerful and well on the phone. I do not appreciate any SOB. Speech and thought processing are grossly intact. Patient reported vitals:  Assessment and Plan: Fluttering heart  -Increasing in frequency -Continue lifestyle modifications including  limiting caffeine intake, self-care to reduce stress -Discussed Wellbutrin can cause tachycardia the patient has been stable on the medication times several months and symptoms were occurring prior to starting Wellbutrin. -Cardiology for Holter monitor -Previous cardiology referral closed.  Will place new referral -Given precautions - Plan: Ambulatory referral to Cardiology  Concentration deficit -Discussed testing for adult ADHD as symptoms can often overlap with depression and anxiety. -Patient given information on area providers that offer testing.  Pt to call and schedule appointment.  Will place referral if needed.  Depression, recurrent -Stable -Continue Wellbutrin XL 150 mg daily  Follow Up Instructions: F/u as needed  I did not refer this patient for an OV in the next 24 hours for this/these issue(s).  I discussed the assessment and treatment plan with the patient. The patient was provided an opportunity to ask questions and all were answered. The patient agreed with the plan and demonstrated an understanding of the instructions.   The patient was advised to call back or seek an in-person evaluation if the symptoms worsen or if the condition fails to improve as anticipated.  I provided 13 minutes of non-face-to-face time during this encounter.   Deeann Saint, MD

## 2020-06-29 ENCOUNTER — Ambulatory Visit (INDEPENDENT_AMBULATORY_CARE_PROVIDER_SITE_OTHER): Payer: No Typology Code available for payment source | Admitting: Cardiovascular Disease

## 2020-06-29 ENCOUNTER — Other Ambulatory Visit: Payer: Self-pay

## 2020-06-29 ENCOUNTER — Encounter: Payer: Self-pay | Admitting: Cardiovascular Disease

## 2020-06-29 VITALS — BP 101/65 | HR 79 | Ht 63.0 in | Wt 129.6 lb

## 2020-06-29 DIAGNOSIS — R002 Palpitations: Secondary | ICD-10-CM | POA: Diagnosis not present

## 2020-06-29 DIAGNOSIS — R012 Other cardiac sounds: Secondary | ICD-10-CM | POA: Diagnosis not present

## 2020-06-29 NOTE — Progress Notes (Signed)
Cardiology Office Note:    Date:  06/30/2020   ID:  Amy Cooke, DOB 1986-07-11, MRN [id number]  PCP:  Deeann Saint, MD  [facility] Cardiologist:  No primary care provider on file.  [facility] Electrophysiologist:  None   Referring MD: Deeann Saint, MD   Chief Complaint  Patient presents with  . Palpitations    History of Present Illness:    Amy Cooke is a 34 y.o. female with a hx of good health, presenting with complaints of palpitations.  She initially noticed these about 6 months ago and they have been increasing in frequency.  They now occur at least once daily.  She describes the palpitations as a sensation of her chest being "overfull", followed by a sensation of skipping.  The palpitations are isolated, but can occur several times within an hour.  They do not associate dizziness, syncope, shortness of breath, and chest discomfort.  She has good functional status, although she does not exercise on a regular basis.  She denies orthopnea, PND, lower extremity edema, chest discomfort with activity or history of syncope.  There is no family history of unexplained early death or sudden death.  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  She is also concerned about the health of her 60 year old son, who is going to school but is not quite old enough to receive the vaccine again.  She has tried to cut back on caffeine intake and this seems to have some positive improvement on the frequency of her palpitations.  She wonders whether the Wellbutrin that she takes for depression might be eating causing the palpitations.  There is no clear temporal association between the initiation of this medication and her symptoms.   Past Medical History:    Diagnosis Date  . Depression   . Frequent headaches   . Panic attack     Past Surgical History:  Procedure Laterality Date  . ABDOMINAL SURGERY    . EYE SURGERY      Current Medications: Current Meds  Medication Sig  . buPROPion (WELLBUTRIN XL) 150 MG 24 hr tablet Take 1 tablet (150 mg total) by mouth daily.     Allergies:   Ceclor [cefaclor]   Social History   Socioeconomic History  . Marital status: Divorced    Spouse name: Not on file  . Number of children: 1  . Years of education: Not on file  . Highest education level: Not on file  Occupational History  . Not on file  Tobacco Use  . Smoking status: Never Smoker  . Smokeless tobacco: Never Used  Vaping Use  . Vaping Use: Never used  Substance and Sexual Activity  . Alcohol use: Yes    Comment: social  . Drug use: No  . Sexual activity: Never    Birth control/protection: None  Other Topics Concern  . Not on file  Social History Narrative  . Not on file   Social Determinants of Health   Financial Resource Strain:   . Difficulty of Paying Living Expenses: Not on file  Food Insecurity:   . Worried About Programme researcher, broadcasting/film/video in the Last Year: Not on file  . Ran Out of Food in the Last Year: Not  on file  Transportation Needs:   . Lack of Transportation (Medical): Not on file  . Lack of Transportation (Non-Medical): Not on file  Physical Activity:   . Days of Exercise per Week: Not on file  . Minutes of Exercise per Session: Not on file  Stress:   . Feeling of Stress : Not on file  Social Connections:   . Frequency of Communication with Friends and Family: Not on file  . Frequency of Social Gatherings with Friends and Family: Not on file  . Attends Religious Services: Not on file  . Active Member of Clubs or Organizations: Not on file  . Attends Banker Meetings: Not on file  . Marital Status: Not on file     Family History: The patient's family history includes Alcohol abuse in her  father; Birth defects in her brother; Cancer in her mother; Depression in her mother; Diabetes in her father; Drug abuse in her father; Early death in her brother, father, and mother; Hearing loss in her maternal grandfather; Heart disease in her maternal grandfather; Learning disabilities in her brother; Miscarriages / [place] in her mother; Stroke in her maternal grandfather and paternal grandfather.  ROS:   Please see the history of present illness.     All other systems reviewed and are negative.  EKGs/Labs/Other Studies Reviewed:    The following studies were reviewed today:   EKG:  EKG is  ordered today.  The ekg ordered today demonstrates shows normal sinus rhythm with sinus arrhythmia views and is otherwise a normal tracing.  QTc is normal at 410 ms.  There is no evidence of ST segment changes or epsilon wave.  There is no evidence of preexcitation.  Recent Labs: 12/17/2019: BUN 16; Creat 0.83; Hemoglobin 12.9; Platelets 232; Potassium 4.7; Sodium 138; TSH 0.85  Recent Lipid Panel No results found for: CHOL, TRIG, HDL, CHOLHDL, VLDL, LDLCALC, LDLDIRECT  Physical Exam:    VS:  BP 101/65   Pulse 79   Ht 5\' 3"  (1.6 m)   Wt 129 lb 9.6 oz (58.8 kg)   SpO2 100%   BMI 22.96 kg/m     Wt Readings from Last 3 Encounters:  06/29/20 129 lb 9.6 oz (58.8 kg)  05/12/20 130 lb (59 kg)  04/14/20 133 lb (60.3 kg)     GEN: Lean. Well nourished, well developed in no acute distress HEENT: Normal NECK: No JVD; No carotid bruits LYMPHATICS: No lymphadenopathy CARDIAC: RRR, no murmurs, rubs, gallops.  She appears to have a faint systolic murmur click at the apex, that shows little if any change with handgrip, but seems a little more evident with the Valsalva maneuver.  There is no accompanying murmur at rest or with provocative maneuvers. RESPIRATORY:  Clear to auscultation without rales, wheezing or rhonchi  ABDOMEN: Soft, non-tender, non-distended MUSCULOSKELETAL:  No edema; No  deformity  SKIN: Warm and dry NEUROLOGIC:  Alert and oriented x 3 PSYCHIATRIC:  Normal affect   ASSESSMENT:    1. Palpitations   2. Abnormal heart sounds    PLAN:    In order of problems listed above:  1. Palpitations: It sounds like she is describing isolated PACs and/or PVCs.  These are quite unpredictable and we may not be able to catch them on routine monitoring.  Suggested a cardia device or smart watch with the ability to record an electrical tracing.  She can submit symptomatic tracings to me via MyChart.  Agreed with her decision to avoid stimulants such as  caffeine.  I do not think that the Wellbutrin is likely to be the cause of her symptoms. 2. Systolic click: Physical exam is suggested of possible mitral valve prolapse.  We will check an echocardiogram.  If this disorder is identified, it may explain her propensity to have PVCs.   Medication Adjustments/Labs and Tests Ordered: Current medicines are reviewed at length with the patient today.  Concerns regarding medicines are outlined above.  Orders Placed This Encounter  Procedures  . EKG 12-Lead  . ECHOCARDIOGRAM COMPLETE   No orders of the defined types were placed in this encounter.   Patient Instructions  Medication Instructions:  No changes *If you need a refill on your cardiac medications before your next appointment, please call your pharmacy*   Lab Work: None ordered If you have labs (blood work) drawn today and your tests are completely normal, you will receive your results only by: Marland Kitchen MyChart Message (if you have MyChart) OR . A paper copy in the mail If you have any lab test that is abnormal or we need to change your treatment, we will call you to review the results.   Testing/Procedures: Your physician has requested that you have an echocardiogram. Echocardiography is a painless test that uses sound waves to create images of your heart. It provides your doctor with information about the size and shape  of your heart and how well your heart's chambers and valves are working. You may receive an ultrasound enhancing agent through an IV if needed to better visualize your heart during the echo.This procedure takes approximately one hour. There are no restrictions for this procedure. This will take place at the [address].    Follow-Up: At [facility], you and your health needs are our priority.  As part of our continuing mission to provide you with exceptional heart care, we have created designated Provider Care Teams.  These Care Teams include your primary Cardiologist (physician) and Advanced Practice Providers (APPs -  Physician Assistants and Nurse Practitioners) who all work together to provide you with the care you need, when you need it.  We recommend signing up for the patient portal called "MyChart".  Sign up information is provided on this After Visit Summary.  MyChart is used to connect with patients for Virtual Visits (Telemedicine).  Patients are able to view lab/test results, encounter notes, upcoming appointments, etc.  Non-urgent messages can be sent to your provider as well.   To learn more about what you can do with MyChart, go to [email or web address].    Your next appointment:   Follow up as needed with Dr. Royann Shivers   Other Instructions Please look into buying a Kardia to monitor your rhythm      Signed, Thurmon Fair, MD  06/30/2020 8:09 AM    [facility]

## 2020-06-29 NOTE — Patient Instructions (Signed)
Medication Instructions:  No changes *If you need a refill on your cardiac medications before your next appointment, please call your pharmacy*   Lab Work: None ordered If you have labs (blood work) drawn today and your tests are completely normal, you will receive your results only by: Marland Kitchen MyChart Message (if you have MyChart) OR . A paper copy in the mail If you have any lab test that is abnormal or we need to change your treatment, we will call you to review the results.   Testing/Procedures: Your physician has requested that you have an echocardiogram. Echocardiography is a painless test that uses sound waves to create images of your heart. It provides your doctor with information about the size and shape of your heart and how well your heart's chambers and valves are working. You may receive an ultrasound enhancing agent through an IV if needed to better visualize your heart during the echo.This procedure takes approximately one hour. There are no restrictions for this procedure. This will take place at the [address].    Follow-Up: At [facility], you and your health needs are our priority.  As part of our continuing mission to provide you with exceptional heart care, we have created designated Provider Care Teams.  These Care Teams include your primary Cardiologist (physician) and Advanced Practice Providers (APPs -  Physician Assistants and Nurse Practitioners) who all work together to provide you with the care you need, when you need it.  We recommend signing up for the patient portal called "MyChart".  Sign up information is provided on this After Visit Summary.  MyChart is used to connect with patients for Virtual Visits (Telemedicine).  Patients are able to view lab/test results, encounter notes, upcoming appointments, etc.  Non-urgent messages can be sent to your provider as well.   To learn more about what you can do with MyChart, go to [email or web address].     Your next appointment:   Follow up as needed with Dr. Royann Shivers   Other Instructions Please look into buying a Kardia to monitor your rhythm

## 2020-06-30 ENCOUNTER — Encounter: Payer: Self-pay | Admitting: Cardiovascular Disease

## 2020-07-18 ENCOUNTER — Ambulatory Visit (HOSPITAL_COMMUNITY): Payer: No Typology Code available for payment source | Attending: Cardiovascular Disease

## 2020-07-18 ENCOUNTER — Other Ambulatory Visit: Payer: Self-pay

## 2020-07-18 DIAGNOSIS — R012 Other cardiac sounds: Secondary | ICD-10-CM | POA: Insufficient documentation

## 2020-07-18 LAB — ECHOCARDIOGRAM COMPLETE
Area-P 1/2: 3.85 cm2
S' Lateral: 3.3 cm

## 2020-07-19 ENCOUNTER — Encounter: Payer: Self-pay | Admitting: Family Medicine

## 2020-07-19 ENCOUNTER — Other Ambulatory Visit: Payer: Self-pay | Admitting: Family Medicine

## 2020-07-19 ENCOUNTER — Ambulatory Visit (INDEPENDENT_AMBULATORY_CARE_PROVIDER_SITE_OTHER): Payer: No Typology Code available for payment source | Admitting: Family Medicine

## 2020-07-19 VITALS — BP 100/58 | HR 97 | Temp 98.7°F | Wt 129.6 lb

## 2020-07-19 DIAGNOSIS — S39012A Strain of muscle, fascia and tendon of lower back, initial encounter: Secondary | ICD-10-CM | POA: Diagnosis not present

## 2020-07-19 DIAGNOSIS — M545 Low back pain, unspecified: Secondary | ICD-10-CM

## 2020-07-19 DIAGNOSIS — F32A Depression, unspecified: Secondary | ICD-10-CM

## 2020-07-19 DIAGNOSIS — R41840 Attention and concentration deficit: Secondary | ICD-10-CM

## 2020-07-19 DIAGNOSIS — F419 Anxiety disorder, unspecified: Secondary | ICD-10-CM | POA: Diagnosis not present

## 2020-07-19 MED ORDER — MELOXICAM 7.5 MG PO TABS: 7.5000 mg | ORAL_TABLET | Freq: Every day | ORAL | 0 refills | Status: DC

## 2020-07-19 MED ORDER — CYCLOBENZAPRINE HCL 5 MG PO TABS: 5.0000 mg | ORAL_TABLET | Freq: Three times a day (TID) | ORAL | 0 refills | Status: DC | PRN

## 2020-07-19 MED FILL — CYCLOBENZAPRINE HCL 5 MG TA: 5 | 10 days supply | Qty: 30 | Fill #0

## 2020-07-19 MED FILL — MELOXICAM 7.5 MG TABLET: 7.5 | 30 days supply | Qty: 30 | Fill #0
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
## 2020-07-26 ENCOUNTER — Other Ambulatory Visit (HOSPITAL_COMMUNITY): Payer: Self-pay | Admitting: Internal Medicine

## 2020-07-26 MED FILL — FLUoxetine HCL 20 MG CAPS: 20 | 30 days supply | Qty: 30 | Fill #0

## 2020-08-01 ENCOUNTER — Encounter: Payer: Self-pay | Admitting: *Deleted

## 2020-08-04 ENCOUNTER — Other Ambulatory Visit: Payer: Self-pay | Admitting: Family Medicine

## 2020-08-04 DIAGNOSIS — F339 Major depressive disorder, recurrent, unspecified: Secondary | ICD-10-CM

## 2020-08-04 MED FILL — FLUoxetine HCL 20 MG CAPS: 20 | 30 days supply | Qty: 30 | Fill #0

## 2020-08-08 NOTE — Telephone Encounter (Signed)
Pt LOV was on 07/19/2020 and last refill was done on 04/14/2020 for 90 tablets, please advise if ok to send refill

## 2020-08-09 ENCOUNTER — Other Ambulatory Visit: Payer: Self-pay | Admitting: Family Medicine

## 2020-08-09 MED FILL — buPROPion HCL ER (XL) 150 M: 150 | 90 days supply | Qty: 90 | Fill #0

## 2020-09-09 MED FILL — FLUoxetine HCL 20 MG CAPS: 20 | 30 days supply | Qty: 30 | Fill #1

## 2020-09-28 ENCOUNTER — Other Ambulatory Visit: Payer: No Typology Code available for payment source

## 2020-10-01 ENCOUNTER — Other Ambulatory Visit (HOSPITAL_COMMUNITY): Payer: Self-pay | Admitting: Family Medicine

## 2020-10-17 MED FILL — FLUoxetine HCL 20 MG CAPS: 20 | 30 days supply | Qty: 30 | Fill #2

## 2020-10-20 ENCOUNTER — Other Ambulatory Visit: Payer: Self-pay

## 2020-10-20 ENCOUNTER — Encounter: Payer: Self-pay | Admitting: Family Medicine

## 2020-10-20 ENCOUNTER — Ambulatory Visit: Payer: No Typology Code available for payment source | Admitting: Family Medicine

## 2020-10-20 ENCOUNTER — Ambulatory Visit (INDEPENDENT_AMBULATORY_CARE_PROVIDER_SITE_OTHER): Payer: No Typology Code available for payment source | Admitting: Family Medicine

## 2020-10-20 ENCOUNTER — Other Ambulatory Visit: Payer: Self-pay | Admitting: Family Medicine

## 2020-10-20 VITALS — BP 98/60 | HR 88 | Temp 98.4°F | Wt 127.0 lb

## 2020-10-20 DIAGNOSIS — R109 Unspecified abdominal pain: Secondary | ICD-10-CM

## 2020-10-20 DIAGNOSIS — G8929 Other chronic pain: Secondary | ICD-10-CM

## 2020-10-20 DIAGNOSIS — R1031 Right lower quadrant pain: Secondary | ICD-10-CM | POA: Diagnosis not present

## 2020-10-20 DIAGNOSIS — M545 Low back pain, unspecified: Secondary | ICD-10-CM

## 2020-10-20 DIAGNOSIS — R413 Other amnesia: Secondary | ICD-10-CM

## 2020-10-20 DIAGNOSIS — R1900 Intra-abdominal and pelvic swelling, mass and lump, unspecified site: Secondary | ICD-10-CM

## 2020-10-20 LAB — POCT URINALYSIS DIPSTICK
Bilirubin, UA: NEGATIVE
Blood, UA: NEGATIVE
Glucose, UA: NEGATIVE
Ketones, UA: NEGATIVE
Leukocytes, UA: NEGATIVE
Nitrite, UA: NEGATIVE
Protein, UA: NEGATIVE
Spec Grav, UA: 1.015 (ref 1.010–1.025)
Urobilinogen, UA: 0.2 E.U./dL
pH, UA: 7 (ref 5.0–8.0)

## 2020-10-20 LAB — POCT URINE PREGNANCY: Preg Test, Ur: NEGATIVE

## 2020-10-20 MED ORDER — MELOXICAM 7.5 MG PO TABS: 7.5000 mg | ORAL_TABLET | Freq: Every day | ORAL | 3 refills | Status: AC

## 2020-10-20 MED ORDER — CYCLOBENZAPRINE HCL 5 MG PO TABS: 5.0000 mg | ORAL_TABLET | Freq: Three times a day (TID) | ORAL | 3 refills | Status: DC | PRN

## 2020-10-20 NOTE — Progress Notes (Signed)
Subjective:    Patient ID: Amy Cooke, female    DOB: [birth year], 35 y.o.   MRN: [id number]  No chief complaint on file.   HPI Patient was seen today for ongoing concern.  Patient endorses intermittent right side pain 2-3 weeks.  Symptoms initially started while patient during LMP but has continued.  Patient notes suprapubic pain, at times having muscle spasms in right flank.  Denies pain that moves.  Patient still has her appendix.  Patient also notes menses seems heavier and having pelvic fullness.  Patient also notes difficulty remembering things since leaving the [employer].  Could not remember what she bought her bf for his birthday this yr.  Notes some improvement in attention and memory since restarting wellbutrin xl after seeing [place] attention specialist.  On med x 3 months.  Also on prozac 20 mg.  Pt requesting refill on mobic and flexeril for intermittent low back pain.  Past Medical History:  Diagnosis Date  . Depression   . Frequent headaches   . Panic attack     Allergies  Allergen Reactions  . Ceclor [Cefaclor] Other (See Comments)    Childhood allergy    ROS General: Denies fever, chills, night sweats, changes in weight, changes in appetite  +changes in memory HEENT: Denies headaches, ear pain, changes in vision, rhinorrhea, sore throat CV: Denies CP, palpitations, SOB, orthopnea Pulm: Denies SOB, cough, wheezing GI: Denies abdominal pain, nausea, vomiting, diarrhea, constipation GU: Denies dysuria, hematuria, frequency, vaginal discharge Msk: Denies muscle cramps, joint pains +R superpubic pain and R flank pain Neuro: Denies weakness, numbness, tingling Skin: Denies rashes, bruising Psych: Denies anxiety, hallucinations  +depression    Objective:    Blood pressure 98/60, pulse 88, temperature 98.4 F (36.9 C), temperature source Oral, weight 127 lb (57.6 kg), SpO2 98 %.  Gen. Pleasant, well-nourished, in no distress, normal affect   HEENT: [place]/AT,  face symmetric, conjunctiva clear, no scleral icterus, PERRLA, EOMI, nares patent without drainage Lungs: no accessory muscle use, CTAB, no wheezes or rales Cardiovascular: RRR, no m/r/g, no peripheral edema Abdomen: BS present, soft, mild TTP RLQ/ND, no hepatosplenomegaly.  No CVA tenderness Musculoskeletal: No deformities, no cyanosis or clubbing, normal tone Neuro:  A&Ox3, CN II-XII intact, normal gait Skin:  Warm, no lesions/ rash   Wt Readings from Last 3 Encounters:  07/19/20 129 lb 9.6 oz (58.8 kg)  06/29/20 129 lb 9.6 oz (58.8 kg)  05/12/20 130 lb (59 kg)    Lab Results  Component Value Date   WBC 6.4 12/17/2019   HGB 12.9 12/17/2019   HCT 38.9 12/17/2019   PLT 232 12/17/2019   GLUCOSE 80 12/17/2019   ALT 15 12/02/2016   AST 20 12/02/2016   NA 138 12/17/2019   K 4.7 12/17/2019   CL 103 12/17/2019   CREATININE 0.83 12/17/2019   BUN 16 12/17/2019   CO2 26 12/17/2019   TSH 0.85 12/17/2019    Assessment/Plan:  Right flank pain  -consider renal calculi -UA negative - Plan: POCT urinalysis dipstick  RLQ abdominal pain  -discussed possible causes including ovarian cyst, fibroids, ectopic, appendicitis, constipation, endometriosis -UhCG negative - Plan: US Pelvic Complete With Transvaginal  Pelvic fullness  -consider fibroids, constipation, endometriosis, ectopic -UhCG negative -for continued or worsened symptoms will place referral to OB/Gyn -given strict precautions - Plan: US Pelvic Complete With Transvaginal, POCT urine pregnancy  Memory change  - Plan: Neuropsychological testing  Chronic bilateral low back pain without sciatica  -continue stretching and other  supportive care -consider PT - Plan: cyclobenzaprine (FLEXERIL) 5 MG tablet, meloxicam (MOBIC) 7.5 MG tablet  F/u prn  Abbe [place], MD

## 2020-10-20 NOTE — Patient Instructions (Signed)
Flank Pain, Adult Flank pain is pain in your side. The flank is the area of your side between your upper belly (abdomen) and your back. The pain may occur over a short time (acute), or it may be long-term or come back often (chronic). It may be mild or very bad. Pain in this area can be caused by many different things. Follow these instructions at home:  Drink enough fluid to keep your pee (urine) clear or pale yellow.  Rest as told by your doctor.  Take over-the-counter and prescription medicines only as told by your doctor.  Keep a journal to keep track of: ? What has caused your flank pain. ? What has made it feel better.  Keep all follow-up visits as told by your doctor. This is important.   Contact a doctor if:  Medicine does not help your pain.  You have new symptoms.  Your pain gets worse.  You have a fever.  Your symptoms last longer than 2-3 days.  You have trouble peeing.  You are peeing more often than normal. Get help right away if:  You have trouble breathing.  You are short of breath.  Your belly hurts, or it is swollen or red.  You feel sick to your stomach (nauseous).  You throw up (vomit).  You feel like you will pass out, or you do pass out (faint).  You have blood in your pee. Summary  Flank pain is pain in your side. The flank is the area of your side between your upper belly (abdomen) and your back.  Flank pain may occur over a short time (acute), or it may be long-term or come back often (chronic). It may be mild or very bad.  Pain in this area can be caused by many different things.  Contact your doctor if your symptoms get worse or they last longer than 2-3 days. This information is not intended to replace advice given to you by your health care provider. Make sure you discuss any questions you have with your health care provider. Document Revised: 06/09/2020 Document Reviewed: 06/09/2020 Elsevier Patient Education  2021 Elsevier  Inc.  Uterine Fibroids  Uterine fibroids, also called leiomyomas, are noncancerous (benign) tumors that can grow in the uterus. They can cause heavy menstrual bleeding and pain. Fibroids may also grow in the fallopian tubes, cervix, or tissues (ligaments) near the uterus. You may have one or many fibroids. Fibroids vary in size, weight, and where they grow in the uterus. Some can become quite large. Most fibroids do not require medical treatment. What are the causes? The cause of this condition is not known. What increases the risk? You are more likely to develop this condition if you:  Are in your 30s or 40s and have not gone through menopause.  Have a family history of this condition.  Are of African American descent.  Started your menstrual period at age 61 or younger.  Have never given birth.  Are overweight or obese. What are the signs or symptoms? Many women do not have any symptoms. Symptoms of this condition may include:  Heavy menstrual bleeding.  Bleeding between menstrual periods.  Pain and pressure in the pelvic area, between your hip bones.  Pain during sex.  Bladder problems, such as needing to urinate right away or more often than usual.  Inability to have children (infertility).  Failure to carry pregnancy to term (miscarriage). How is this diagnosed? This condition may be diagnosed based on:  Your symptoms  and medical history.  A physical exam.  A pelvic exam that includes feeling for any tumors.  Imaging tests, such as ultrasound or MRI. How is this treated? Treatment for this condition may include follow-up visits with your health care provider to monitor your fibroids for any changes. Other treatment may include:  Medicines, such as: ? Medicines to relieve pain, including aspirin and NSAIDs, such as ibuprofen or naproxen. ? Hormone therapy. Treatment may be given as a pill or an injection, or it may be inserted into the uterus using an  intrauterine device (IUD).  Surgery that would do one of the following: ? Remove the fibroids (myomectomy). This may be recommended if fibroids affect your fertility and you want to become pregnant. ? Remove the uterus (hysterectomy). ? Block the blood supply to the fibroids (uterine artery embolization). This can cause them to shrink and die. Follow these instructions at home: Medicines  Take over-the-counter and prescription medicines only as told by your health care provider.  Ask your health care provider if you should take iron pills or eat more iron-rich foods, such as dark green, leafy vegetables. Heavy menstrual bleeding can cause low iron levels. Managing pain If directed, apply heat to your back or abdomen to reduce pain. Use the heat source that your health care provider recommends, such as a moist heat pack or a heating pad. To apply heat:  Place a towel between your skin and the heat source.  Leave the heat on for 20-30 minutes.  Remove the heat if your skin turns bright red. This is especially important if you are unable to feel pain, heat, or cold. You may have a greater risk of getting burned.   General instructions  Pay close attention to your menstrual cycle. Tell your health care provider about any changes, such as: ? Heavier bleeding that requires you to change your pads or tampons more than usual. ? A change in the number of days that your menstrual period lasts. ? A change in symptoms that come with your menstrual period, such as back pain or cramps in your abdomen.  Keep all follow-up visits. This is important, especially if your fibroids need to be monitored for any changes. Contact a health care provider if you:  Have pelvic pain, back pain, or cramps in your abdomen that do not get better with medicine or heat.  Develop new bleeding between menstrual periods.  Have increased bleeding during or between menstrual periods.  Feel more tired or weak than  usual.  Feel light-headed. Get help right away if you:  Faint.  Have pelvic pain that suddenly gets worse.  Have severe vaginal bleeding that soaks a tampon or pad in 30 minutes or less. Summary  Uterine fibroids are noncancerous (benign) tumors that can develop in the uterus.  The exact cause of this condition is not known.  Most fibroids do not require medical treatment unless they affect your ability to have children (fertility).  Contact a health care provider if you have pelvic pain, back pain, or cramps in your abdomen that do not get better with medicines.  Get help right away if you faint, have pelvic pain that suddenly gets worse, or have severe vaginal bleeding. This information is not intended to replace advice given to you by your health care provider. Make sure you discuss any questions you have with your health care provider. Document Revised: 04/18/2020 Document Reviewed: 04/18/2020 Elsevier Patient Education  2021 Elsevier Inc.  Ovarian Cyst  An  ovarian cyst is a fluid-filled sac that forms on an ovary. The ovaries are small organs that produce eggs in women. Various types of cysts can form on the ovaries. Some may cause symptoms and require treatment. Most ovarian cysts go away on their own, are not cancerous (are benign), and do not cause problems. What are the causes? Ovarian cysts may be caused by:  Ovarian hyperstimulation syndrome. This is a condition that can develop from taking fertility medicines. It causes multiple large ovarian cysts to form.  Polycystic ovarian syndrome (PCOS). This is a common hormonal disorder that can cause ovarian cysts to form, and can cause problems with your period or fertility.  The normal menstrual cycle. What increases the risk? The following factors may make you more likely to develop this condition:  Being overweight or obese.  Taking fertility medicines.  Taking certain forms of hormonal birth  control.  Smoking. What are the signs or symptoms? Many ovarian cysts do not cause symptoms. If symptoms are present, they may include:  Pelvic pain or pressure.  Pain in the lower abdomen.  Pain during sex.  Abdominal swelling.  Abnormal menstrual periods.  Increasing pain with menstrual periods. How is this diagnosed? These cysts are commonly found during a routine pelvic exam. You may have tests to find out more about the cyst, such as:  Ultrasound.  CT scan.  MRI.  Blood tests. How is this treated? Many ovarian cysts go away on their own without treatment. Your health care provider may want to check your cyst regularly for 2-3 months to see if it changes. If you are in menopause, it is especially important to have your cyst monitored closely because menopausal women have a higher rate of ovarian cancer. When treatment is needed, it may include:  Medicines to help relieve pain.  A procedure to drain the cyst (aspiration).  Surgery to remove the whole cyst (cystectomy).  Hormone treatment or birth control pills. These methods are sometimes used to help keep cysts from coming back.  Surgery to remove the ovary (oophorectomy). Follow these instructions at home:  Take over-the-counter and prescription medicines only as told by your health care provider.  Ask your health care provider if any medicine prescribed to you requires you to avoid driving or using machinery.  Get regular pelvic exams and Pap tests as often as told by your health care provider.  Return to your normal activities as told by your health care provider. Ask your health care provider what activities are safe for you.  Do not use any products that contain nicotine or tobacco, such as cigarettes, e-cigarettes, and chewing tobacco. If you need help quitting, ask your health care provider.  Keep all follow-up visits. This is important. Contact a health care provider if:  Your periods are late,  irregular, painful, or they stop.  You have pelvic pain that does not go away.  You have pressure on your bladder or trouble emptying your bladder completely.  You have any of the following: ? A feeling of fullness. ? You are gaining weight or losing weight without changing your exercise and eating habits. ? Pain, swelling, or bloating in the abdomen. ? Loss of appetite. ? Pain and pressure in your back and pelvis.  You think you may be pregnant. Get help right away if:  You have abdominal or pelvic pain that is severe or gets worse.  You cannot eat or drink without vomiting.  You suddenly develop a fever or chills.  Your menstrual period is much heavier than usual. Summary  An ovarian cyst is a fluid-filled sac that forms on an ovary.  Some ovarian cysts may cause symptoms and require treatment.  These cysts are commonly found during a routine pelvic exam.  Many ovarian cysts go away on their own without treatment. This information is not intended to replace advice given to you by your health care provider. Make sure you discuss any questions you have with your health care provider. Document Revised: 02/24/2020 Document Reviewed: 02/24/2020 Elsevier Patient Education  2021 [employer]

## 2020-10-21 NOTE — Telephone Encounter (Signed)
This encounter was created in error - please disregard.

## 2020-10-23 ENCOUNTER — Ambulatory Visit: Payer: No Typology Code available for payment source | Attending: Internal Medicine

## 2020-10-23 ENCOUNTER — Other Ambulatory Visit (HOSPITAL_BASED_OUTPATIENT_CLINIC_OR_DEPARTMENT_OTHER): Payer: Self-pay | Admitting: Internal Medicine

## 2020-10-23 DIAGNOSIS — Z23 Encounter for immunization: Secondary | ICD-10-CM

## 2020-10-23 MED FILL — PFIZER-BIONTECH COVID-19 VA: 30 | 21 days supply | Qty: 0 | Fill #0

## 2020-10-23 NOTE — Progress Notes (Signed)
   Covid-19 Vaccination Clinic  Name:  Amy Cooke    MRN: [id number] DOB: 1986/03/14  10/23/2020  [name] was observed post Covid-19 immunization for 15 minutes without incident. She was provided with Vaccine Information Sheet and instruction to access the V-Safe system.   [name] was instructed to call 911 with any severe reactions post vaccine: Marland Kitchen Difficulty breathing  . Swelling of face and throat  . A fast heartbeat  . A bad rash all over body  . Dizziness and weakness   Immunizations Administered    Name Date Dose VIS Date Route   Pfizer COVID-19 Vaccine 10/23/2020 11:09 AM 0.3 mL 07/19/2020 Intramuscular   Manufacturer: [employer], [employer]   Lot: [id number]   NDC: [address]-7

## 2020-11-07 ENCOUNTER — Encounter: Payer: Self-pay | Admitting: Family Medicine

## 2020-11-07 ENCOUNTER — Ambulatory Visit
Admission: RE | Admit: 2020-11-07 | Discharge: 2020-11-07 | Disposition: A | Discharge status: Home / Self Care | Payer: No Typology Code available for payment source | Source: Ambulatory Visit | Attending: Family Medicine | Admitting: Family Medicine

## 2020-11-07 DIAGNOSIS — R1031 Right lower quadrant pain: Secondary | ICD-10-CM

## 2020-11-07 DIAGNOSIS — R1900 Intra-abdominal and pelvic swelling, mass and lump, unspecified site: Secondary | ICD-10-CM

## 2020-11-09 ENCOUNTER — Other Ambulatory Visit: Payer: Self-pay | Admitting: Family Medicine

## 2020-11-09 DIAGNOSIS — G8929 Other chronic pain: Secondary | ICD-10-CM

## 2020-11-09 DIAGNOSIS — M545 Low back pain, unspecified: Secondary | ICD-10-CM

## 2020-11-09 MED FILL — MELOXICAM 7.5 MG TABLET: 7.5 | 30 days supply | Qty: 30 | Fill #0

## 2020-11-09 MED FILL — CYCLOBENZAPRINE HCL 5 MG TA: 5 | 10 days supply | Qty: 30 | Fill #0

## 2020-11-09 NOTE — Telephone Encounter (Signed)
Spoke with pt state that Rx was sent to the wrong pharmacy and her insurance will not cover the medication cost at [place], pt request to send Rx to [facility]. Spoke to [employer] agreed to contact [facility] to transfer Rx  to pt pharmacy.

## 2020-11-10 ENCOUNTER — Telehealth: Payer: Self-pay | Admitting: Family Medicine

## 2020-11-10 NOTE — Telephone Encounter (Signed)
Patient is calling to have her Flexeril and Meloxicam sent to [facility] rather than [facility].  Please advise.

## 2020-11-10 NOTE — Telephone Encounter (Signed)
Spoke with pt pharmacy verified that Rx have been transferred to the correct pharmacy, pt pharmacy state that the Rx refill is ready for pick up, pt was notifed

## 2020-11-14 ENCOUNTER — Other Ambulatory Visit: Payer: Self-pay

## 2020-11-14 DIAGNOSIS — Z0189 Encounter for other specified special examinations: Secondary | ICD-10-CM

## 2020-11-14 DIAGNOSIS — R413 Other amnesia: Secondary | ICD-10-CM

## 2020-11-17 ENCOUNTER — Encounter: Payer: Self-pay | Admitting: Family Medicine

## 2020-11-17 ENCOUNTER — Telehealth (INDEPENDENT_AMBULATORY_CARE_PROVIDER_SITE_OTHER): Payer: No Typology Code available for payment source | Admitting: Family Medicine

## 2020-11-17 DIAGNOSIS — M5442 Lumbago with sciatica, left side: Secondary | ICD-10-CM

## 2020-11-17 DIAGNOSIS — G8929 Other chronic pain: Secondary | ICD-10-CM | POA: Diagnosis not present

## 2020-11-17 DIAGNOSIS — R102 Pelvic and perineal pain: Secondary | ICD-10-CM

## 2020-11-17 DIAGNOSIS — M5441 Lumbago with sciatica, right side: Secondary | ICD-10-CM | POA: Diagnosis not present

## 2020-11-17 NOTE — Progress Notes (Signed)
Virtual Visit via Video Note  I connected with Amy Cooke on 11/17/20 at  3:30 PM EST by a video enabled telemedicine application 2/2 COVID-19 pandemic and verified that I am speaking with the correct person using two identifiers.  Location patient: home Location provider:work or home office Persons participating in the virtual visit: patient, provider  I discussed the limitations of evaluation and management by telemedicine and the availability of in person appointments. The patient expressed understanding and agreed to proceed.   HPI: Pt states her back "flared up" last wk.  Pt having low back pain with sciatic pain L>R.  Taking flexeril and mobic prn.  Inquires about PT.    Pt has questions about R ovary being larger than L on recent pelvic u/s.  Pt with a h/o suprapubic pain, lower abd tenderness and heavier menses.  Pain has improved some.   ROS: See pertinent positives and negatives per HPI.  Past Medical History:  Diagnosis Date  . Depression   . Frequent headaches   . Panic attack     Past Surgical History:  Procedure Laterality Date  . ABDOMINAL SURGERY    . EYE SURGERY      Family History  Problem Relation Age of Onset  . Cancer Mother   . Depression Mother   . Early death Mother   . Miscarriages / [place] Mother   . Early death Father   . Drug abuse Father   . Diabetes Father   . Alcohol abuse Father   . Birth defects Brother   . Early death Brother   . Learning disabilities Brother   . Hearing loss Maternal Grandfather   . Heart disease Maternal Grandfather   . Stroke Maternal Grandfather   . Stroke Paternal Grandfather     Current Outpatient Medications:  .  buPROPion (WELLBUTRIN XL) 150 MG 24 hr tablet, TAKE 1 TABLET (150 MG TOTAL) BY MOUTH DAILY., Disp: 90 tablet, Rfl: 0 .  cyclobenzaprine (FLEXERIL) 5 MG tablet, Take 1 tablet (5 mg total) by mouth 3 (three) times daily as needed for muscle spasms., Disp: 30 tablet, Rfl: 3 .  FLUoxetine (PROZAC)  20 MG tablet, Take 20 mg by mouth daily., Disp: , Rfl:  .  meloxicam (MOBIC) 7.5 MG tablet, Take 1 tablet (7.5 mg total) by mouth daily., Disp: 30 tablet, Rfl: 3  EXAM:  VITALS per patient if applicable: RR between 12-20 bpm  GENERAL: alert, oriented, appears well and in no acute distress  HEENT: atraumatic, conjunctiva clear, no obvious abnormalities on inspection of external nose and ears  NECK: normal movements of the head and neck  LUNGS: on inspection no signs of respiratory distress, breathing rate appears normal, no obvious gross SOB, gasping or wheezing  CV: no obvious cyanosis  MS: moves all visible extremities without noticeable abnormality  PSYCH/NEURO: pleasant and cooperative, no obvious depression or anxiety, speech and thought processing grossly intact  ASSESSMENT AND PLAN:  Discussed the following assessment and plan:  Chronic bilateral low back pain with bilateral sciatica -Continue supportive care including stretching -Continue Mobic and Flexeril. -We will place order for lumbar spine x-ray. - Plan: Ambulatory referral to Physical Therapy, DG lumbar spine  Pelvic pain in female  -Transvaginal normal on 11/07/2020 - Plan: Ambulatory referral to Physical Therapy  Follow-up as needed for continued or worsening symptoms   I discussed the assessment and treatment plan with the patient. The patient was provided an opportunity to ask questions and all were answered. The patient agreed  with the plan and demonstrated an understanding of the instructions.   The patient was advised to call back or seek an in-person evaluation if the symptoms worsen or if the condition fails to improve as anticipated.    Deeann Saint, MD

## 2020-11-20 ENCOUNTER — Other Ambulatory Visit: Payer: No Typology Code available for payment source

## 2020-11-23 ENCOUNTER — Encounter: Payer: Self-pay | Admitting: Counselor

## 2020-11-24 ENCOUNTER — Other Ambulatory Visit: Payer: Self-pay | Admitting: Family Medicine

## 2020-11-24 DIAGNOSIS — F339 Major depressive disorder, recurrent, unspecified: Secondary | ICD-10-CM

## 2020-11-24 MED FILL — buPROPion HCL ER (XL) 150 M: 150 | 90 days supply | Qty: 90 | Fill #0

## 2020-11-24 MED FILL — FLUoxetine HCL 20 MG CAPS: 20 | 30 days supply | Qty: 30 | Fill #3

## 2020-11-27 ENCOUNTER — Other Ambulatory Visit: Payer: Self-pay

## 2020-11-27 ENCOUNTER — Other Ambulatory Visit: Payer: No Typology Code available for payment source

## 2020-11-27 ENCOUNTER — Ambulatory Visit (INDEPENDENT_AMBULATORY_CARE_PROVIDER_SITE_OTHER): Payer: No Typology Code available for payment source

## 2020-11-27 DIAGNOSIS — M5442 Lumbago with sciatica, left side: Secondary | ICD-10-CM

## 2020-11-27 DIAGNOSIS — G8929 Other chronic pain: Secondary | ICD-10-CM | POA: Diagnosis not present

## 2020-11-27 DIAGNOSIS — M5441 Lumbago with sciatica, right side: Secondary | ICD-10-CM

## 2020-12-18 ENCOUNTER — Ambulatory Visit: Payer: No Typology Code available for payment source | Attending: Physical Therapy | Admitting: Physical Therapy

## 2020-12-28 MED FILL — FLUoxetine HCL 20 MG CAPS: 20 | 30 days supply | Qty: 30 | Fill #4

## 2021-01-02 ENCOUNTER — Other Ambulatory Visit: Payer: Self-pay

## 2021-01-02 ENCOUNTER — Ambulatory Visit: Payer: No Typology Code available for payment source | Attending: Family Medicine | Admitting: Physical Therapy

## 2021-01-02 DIAGNOSIS — M6281 Muscle weakness (generalized): Secondary | ICD-10-CM | POA: Insufficient documentation

## 2021-01-02 DIAGNOSIS — G8929 Other chronic pain: Secondary | ICD-10-CM | POA: Diagnosis present

## 2021-01-02 DIAGNOSIS — M545 Low back pain, unspecified: Secondary | ICD-10-CM | POA: Diagnosis present

## 2021-01-02 DIAGNOSIS — R252 Cramp and spasm: Secondary | ICD-10-CM | POA: Diagnosis present

## 2021-01-02 NOTE — Patient Instructions (Signed)
Access Code: [email or web address] URL: [email or web address] Date: 01/02/2021 Prepared by: Dwana Curd  Exercises Supine Pelvic Floor Stretch - Hands on Knees - 1 x daily - 7 x weekly - 1 sets - 3 reps - 30 hold Cat-Camel to Child's Pose - 1 x daily - 7 x weekly - 1 sets - 5 reps - 10 sec hold 90/90 SI Joint Self-Correction - 1 x daily - 7 x weekly - 1 sets - 5 reps - 10 hold Seated Diaphragmatic Breathing - 3 x daily - 7 x weekly - 1 sets - 10 reps
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
## 2021-01-11 ENCOUNTER — Encounter: Payer: No Typology Code available for payment source | Admitting: Counselor

## 2021-01-18 ENCOUNTER — Encounter: Payer: No Typology Code available for payment source | Admitting: Counselor

## 2021-01-25 ENCOUNTER — Ambulatory Visit: Payer: No Typology Code available for payment source | Admitting: Physical Therapy

## 2021-01-27 MED FILL — Fluoxetine HCl Cap 20 MG: ORAL | 30 days supply | Qty: 30 | Fill #0 | Status: AC

## 2021-01-29 ENCOUNTER — Other Ambulatory Visit (HOSPITAL_COMMUNITY): Payer: Self-pay

## 2021-01-30 ENCOUNTER — Encounter: Payer: Self-pay | Admitting: Physical Therapy

## 2021-01-30 ENCOUNTER — Other Ambulatory Visit: Payer: Self-pay

## 2021-01-30 ENCOUNTER — Ambulatory Visit: Payer: No Typology Code available for payment source | Attending: Family Medicine | Admitting: Physical Therapy

## 2021-01-30 DIAGNOSIS — M545 Low back pain, unspecified: Secondary | ICD-10-CM | POA: Insufficient documentation

## 2021-01-30 DIAGNOSIS — R252 Cramp and spasm: Secondary | ICD-10-CM | POA: Insufficient documentation

## 2021-01-30 DIAGNOSIS — G8929 Other chronic pain: Secondary | ICD-10-CM | POA: Diagnosis present

## 2021-01-30 DIAGNOSIS — M6281 Muscle weakness (generalized): Secondary | ICD-10-CM | POA: Insufficient documentation

## 2021-01-30 NOTE — Therapy (Signed)
[facility] [address], [place], [place], [address] Phone: [phone]   Fax:  [phone]  Physical Therapy Treatment  Patient Details  Name: Amy Cooke MRN: [id number] Date of Birth: [birth year] Referring Provider (PT): Billie Ruddy, MD   Encounter Date: 01/30/2021   PT End of Session - 01/30/21 1716    Visit Number 2    Date for PT Re-Evaluation 03/27/21    Authorization Type [employer] employee    PT Start Time 1407   late   PT Stop Time 1445    PT Time Calculation (min) 38 min    Activity Tolerance Patient tolerated treatment well    Behavior During Therapy [facility] for tasks assessed/performed           Past Medical History:  Diagnosis Date  . Depression   . Frequent headaches   . Panic attack     Past Surgical History:  Procedure Laterality Date  . ABDOMINAL SURGERY    . EYE SURGERY      There were no vitals filed for this visit.   Subjective Assessment - 01/30/21 1410    Subjective Pt states that being still irritates the back.  It has been 5/10 at the worst in the morning or when sitting a lot. Pt states she is not having any urgency that is bothering her right now.    Currently in Pain? No/denies                             [facility] Adult PT Treatment/Exercise - 01/30/21 0001      Neuro Re-ed    Neuro Re-ed Details  trasnversus abdominus      Exercises   Exercises Lumbar      Lumbar Exercises: Supine   Ab Set 20 reps    Bent Knee Raise 20 reps    Other Supine Lumbar Exercises clam 20 reps                    PT Short Term Goals - 01/30/21 1716      PT SHORT TERM GOAL #1   Title ind in using urge drills    Status Achieved      PT SHORT TERM GOAL #2   Title ind with transversus abdominus contraction    Status On-going             PT Long Term Goals - 01/30/21 1421      PT LONG TERM GOAL #1   Title Pt will report at least 75% less  occurance of pain in low back and Rt flank pain    Baseline at most 5/10 and not all the time    Status Partially Met      PT LONG TERM GOAL #2   Title Pt will report at least 75% less urgency    Baseline 50% improved    Status Partially Met      PT LONG TERM GOAL #3   Title Pt will be ind with advnaced HEP    Status On-going      PT LONG TERM GOAL #4   Title FOTO improved to 63/100 for improved functional activities    Status On-going                 Plan - 01/30/21 1717    Clinical Impression Statement Pt at first follow up since eval.  No goals met  today.  Pt did feel like she was able to activate the core and transversus abdominus.  She did well with basic core exercises needed a lot of tactile and verbal cues.  Continue to porgress core strength for reduced back pain and functional activities.    PT Treatment/Interventions ADLs/Self Care Home Management;Biofeedback;Cryotherapy;Electrical Stimulation;Moist Heat;Neuromuscular re-education;Therapeutic exercise;Therapeutic activities;Gait training;Patient/family education;Manual techniques;Dry needling;Taping    PT Next Visit Plan porgress core strength as tolerated, lifting and exhale on exertion    PT Home Exercise Plan Access Code: [email or web address] and urge drills    Consulted and Agree with Plan of Care Patient           Patient will benefit from skilled therapeutic intervention in order to improve the following deficits and impairments:  Decreased coordination,Pain,Postural dysfunction,Decreased strength  Visit Diagnosis: Chronic low back pain, unspecified back pain laterality, unspecified whether sciatica present  Muscle weakness (generalized)  Cramp and spasm     Problem List Patient Active Problem List   Diagnosis Date Noted  . Fluttering heart 12/17/2019  . Depression, recurrent ([place]) 11/12/2019    Jule Ser, PT 01/30/2021, 5:28 PM  [facility] [address] [place], [place], [address] Phone: [phone]   Fax:  [phone]  Name: Amy Cooke MRN: [id number] Date of Birth: 01-26-1986

## 2021-02-08 ENCOUNTER — Other Ambulatory Visit: Payer: Self-pay

## 2021-02-08 ENCOUNTER — Encounter: Payer: Self-pay | Admitting: Physical Therapy

## 2021-02-08 ENCOUNTER — Ambulatory Visit: Payer: No Typology Code available for payment source | Admitting: Physical Therapy

## 2021-02-08 DIAGNOSIS — M545 Low back pain, unspecified: Secondary | ICD-10-CM

## 2021-02-08 DIAGNOSIS — M6281 Muscle weakness (generalized): Secondary | ICD-10-CM

## 2021-02-08 DIAGNOSIS — G8929 Other chronic pain: Secondary | ICD-10-CM

## 2021-02-08 DIAGNOSIS — R252 Cramp and spasm: Secondary | ICD-10-CM
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
## 2021-02-20 ENCOUNTER — Ambulatory Visit: Payer: No Typology Code available for payment source | Admitting: Physical Therapy

## 2021-02-21 ENCOUNTER — Other Ambulatory Visit (HOSPITAL_COMMUNITY): Payer: Self-pay

## 2021-02-21 ENCOUNTER — Other Ambulatory Visit: Payer: Self-pay | Admitting: Family Medicine

## 2021-02-21 DIAGNOSIS — F339 Major depressive disorder, recurrent, unspecified: Secondary | ICD-10-CM

## 2021-02-22 ENCOUNTER — Other Ambulatory Visit (HOSPITAL_COMMUNITY): Payer: Self-pay

## 2021-02-22 MED ORDER — BUPROPION HCL ER (XL) 150 MG PO TB24
ORAL_TABLET | Freq: Every day | ORAL | 0 refills | Status: DC
  Filled 2021-02-22 – 2021-03-08 (×2): qty 90, 90d supply, fill #0

## 2021-02-27 ENCOUNTER — Telehealth: Payer: Self-pay | Admitting: Physical Therapy

## 2021-02-27 ENCOUNTER — Ambulatory Visit: Payer: No Typology Code available for payment source | Admitting: Physical Therapy

## 2021-02-27 NOTE — Telephone Encounter (Signed)
PT called pt who reports she has been sick and meant to call to cancel but forgot.  Pt was reminded of next visit and states she will continue to do the exercises given until then.  She feels they are helping.  Russella Dar, PT 02/27/21 2:21 PM

## 2021-03-02 ENCOUNTER — Other Ambulatory Visit (HOSPITAL_COMMUNITY): Payer: Self-pay

## 2021-03-02 MED FILL — Fluoxetine HCl Cap 20 MG: ORAL | 30 days supply | Qty: 30 | Fill #1 | Status: AC

## 2021-03-03 ENCOUNTER — Other Ambulatory Visit (HOSPITAL_COMMUNITY): Payer: Self-pay

## 2021-03-06 ENCOUNTER — Other Ambulatory Visit (HOSPITAL_COMMUNITY): Payer: Self-pay

## 2021-03-06 ENCOUNTER — Ambulatory Visit: Payer: No Typology Code available for payment source | Attending: Family Medicine | Admitting: Physical Therapy

## 2021-03-06 ENCOUNTER — Encounter: Payer: Self-pay | Admitting: Physical Therapy

## 2021-03-06 ENCOUNTER — Other Ambulatory Visit: Payer: Self-pay

## 2021-03-06 DIAGNOSIS — M545 Low back pain, unspecified: Secondary | ICD-10-CM | POA: Insufficient documentation

## 2021-03-06 DIAGNOSIS — M6281 Muscle weakness (generalized): Secondary | ICD-10-CM | POA: Insufficient documentation

## 2021-03-06 DIAGNOSIS — R252 Cramp and spasm: Secondary | ICD-10-CM | POA: Insufficient documentation

## 2021-03-06 DIAGNOSIS — G8929 Other chronic pain: Secondary | ICD-10-CM | POA: Diagnosis present

## 2021-03-06 MED ORDER — FLUOXETINE HCL 20 MG PO CAPS
20.0000 mg | ORAL_CAPSULE | Freq: Every day | ORAL | 3 refills | Status: AC
  Filled 2021-03-06 – 2021-04-17 (×2): qty 90, 90d supply, fill #0

## 2021-03-06 NOTE — Therapy (Signed)
[facility] [address] [place], [place], [address] Phone: [phone]   Fax:  [phone]  Physical Therapy Treatment  Patient Details  Name: Amy Cooke MRN: [id number] Date of Birth: 1986/03/13 Referring Provider (PT): Billie Ruddy, MD   Encounter Date: 03/06/2021   PT End of Session - 03/06/21 1523    Visit Number 4    Date for PT Re-Evaluation 03/27/21    Authorization Type [employer] employee    PT Start Time 1409   late   PT Stop Time 1444    PT Time Calculation (min) 35 min           Past Medical History:  Diagnosis Date  . Depression   . Frequent headaches   . Panic attack     Past Surgical History:  Procedure Laterality Date  . ABDOMINAL SURGERY    . EYE SURGERY      There were no vitals filed for this visit.   Subjective Assessment - 03/06/21 1447    Subjective Pt states she is doing great.. Just stiff and not having the urgency.    Currently in Pain? No/denies                             [facility] Adult PT Treatment/Exercise - 03/06/21 0001      Lumbar Exercises: Standing   Other Standing Lumbar Exercises side steps      Lumbar Exercises: Supine   Other Supine Lumbar Exercises core sets in 90-90 - LE taps, UE flex, hollow holding      Lumbar Exercises: Quadruped   [place] 10 reps    Single Arm Raise 10 reps    Opposite Arm/Leg Raise 10 reps    Other Quadruped Lumbar Exercises primal push up                    PT Short Term Goals - 03/06/21 1411      PT SHORT TERM GOAL #2   Title ind with transversus abdominus contraction    Status Achieved             PT Long Term Goals - 03/06/21 1412      PT LONG TERM GOAL #1   Title Pt will report at least 75% less occurance of pain in low back and Rt flank pain    Baseline at least 75% less    Status Achieved      PT LONG TERM GOAL #2   Title Pt will report at least 75% less  urgency    Baseline haven't noticed at all    Status Achieved      PT LONG TERM GOAL #3   Title Pt will be ind with advnaced HEP    Status Achieved                 Plan - 03/06/21 1445    Clinical Impression Statement pt has met all goals and able to perform final HEP for continued core strength.  Pt is aware of how to progress and is performing functional tasks well.  Pt will d/c from skilled PT today.    PT Treatment/Interventions ADLs/Self Care Home Management;Biofeedback;Cryotherapy;Electrical Stimulation;Moist Heat;Neuromuscular re-education;Therapeutic exercise;Therapeutic activities;Gait training;Patient/family education;Manual techniques;Dry needling;Taping    PT Next Visit Plan d/c today    PT Home Exercise Plan Access Code: [email or web address] and urge drills    Consulted and Agree with Plan  of Care Patient           Patient will benefit from skilled therapeutic intervention in order to improve the following deficits and impairments:  Decreased coordination,Pain,Postural dysfunction,Decreased strength  Visit Diagnosis: Chronic low back pain, unspecified back pain laterality, unspecified whether sciatica present  Muscle weakness (generalized)  Cramp and spasm     Problem List Patient Active Problem List   Diagnosis Date Noted  . Fluttering heart 12/17/2019  . Depression, recurrent ([place]) 11/12/2019    Jule Ser, PT 03/06/2021, 3:57 PM  [facility] [address], [place], [place], [address] Phone: [phone]   Fax:  [phone]  Name: Amy Cooke MRN: [id number] Date of Birth: [birth year]  PHYSICAL THERAPY DISCHARGE SUMMARY  Visits from Start of Care: 4  Current functional level related to goals / functional outcomes: See above goals   Remaining deficits: See above   Education / Equipment: HEP Plan: Patient agrees to discharge.  Patient goals were met. Patient is being  discharged due to meeting the stated rehab goals.  ?????    [employer], PT 03/06/21 4:00 PM

## 2021-03-08 ENCOUNTER — Other Ambulatory Visit (HOSPITAL_COMMUNITY): Payer: Self-pay

## 2021-03-08 MED ORDER — DOXYCYCLINE HYCLATE 100 MG PO TABS
100.0000 mg | ORAL_TABLET | Freq: Two times a day (BID) | ORAL | 0 refills | Status: AC
  Filled 2021-03-08: qty 20, 10d supply, fill #0

## 2021-03-09 ENCOUNTER — Other Ambulatory Visit (HOSPITAL_COMMUNITY): Payer: Self-pay

## 2021-03-09 MED ORDER — METRONIDAZOLE 0.75 % VA GEL
VAGINAL | 0 refills | Status: AC
  Filled 2021-03-09: qty 70, 5d supply, fill #0

## 2021-03-19 ENCOUNTER — Other Ambulatory Visit (HOSPITAL_COMMUNITY): Payer: Self-pay

## 2021-03-19 MED ORDER — AZITHROMYCIN 500 MG PO TABS
1000.0000 mg | ORAL_TABLET | Freq: Every day | ORAL | 0 refills | Status: AC
  Filled 2021-03-19: qty 2, 1d supply, fill #0

## 2021-03-24 ENCOUNTER — Other Ambulatory Visit (HOSPITAL_COMMUNITY): Payer: Self-pay

## 2021-03-27 ENCOUNTER — Ambulatory Visit: Payer: No Typology Code available for payment source

## 2021-03-27 ENCOUNTER — Encounter: Payer: Self-pay | Admitting: Counselor

## 2021-03-27 ENCOUNTER — Ambulatory Visit (INDEPENDENT_AMBULATORY_CARE_PROVIDER_SITE_OTHER): Payer: No Typology Code available for payment source | Admitting: Counselor

## 2021-03-27 ENCOUNTER — Other Ambulatory Visit: Payer: Self-pay

## 2021-03-27 DIAGNOSIS — F09 Unspecified mental disorder due to known physiological condition: Secondary | ICD-10-CM

## 2021-03-27 DIAGNOSIS — R41840 Attention and concentration deficit: Secondary | ICD-10-CM

## 2021-03-27 DIAGNOSIS — F418 Other specified anxiety disorders: Secondary | ICD-10-CM

## 2021-03-27 NOTE — Progress Notes (Signed)
   Psychometrist Note   Cognitive testing was administered to [employer] by Lamar Benes, B.S. (Technician) under the supervision of Alphonzo Severance, Psy.D., ABN. Ms. Kriegel was able to tolerate all test procedures. Dr. Nicole Kindred met with the patient as needed to manage any emotional reactions to the testing procedures. Rest breaks were offered.    The battery of tests administered was selected by Dr. Nicole Kindred with consideration to the patient's current level of functioning, the nature of her symptoms, emotional and behavioral responses during the interview, level of literacy, observed level of motivation/effort, and the nature of the referral question. This battery was communicated to the psychometrist. Communication between Dr. Nicole Kindred and the psychometrist was ongoing throughout the evaluation and Dr. Nicole Kindred was immediately accessible at all times. Dr. Nicole Kindred provided supervision to the technician on the date of this service, to the extent necessary to assure the quality of all services provided.    Ms. Mcphee will return in approximately one week for an interactive feedback session with Dr. Nicole Kindred, at which time test performance, clinical impressions, and treatment recommendations will be reviewed in detail. The patient understands she can contact our office should she require our assistance before this time.   A total of 160 minutes of billable time were spent with Ty Hilts by the technician, including test administration and scoring time. Billing for these services is reflected in Dr. Les Pou note.   This note reflects time spent with the psychometrician and does not include test scores, clinical history, or any interpretations made by Dr. Nicole Kindred. The full report will follow in a separate note.

## 2021-03-27 NOTE — Progress Notes (Signed)
NEUROPSYCHOLOGICAL EVALUATION [facility]  Patient Name: Amy GrimeMegan Leighann Cooke MRN: [id number] Date of Birth: 02/03/1986 Age: 35 y.o. Education: 16 years  Referral Circumstances and Background Information  Amy Cooke is a 35 y.o., right-hand dominant, divorced woman with a history of depression presenting for evaluation of memory loss. There is sparse documentation in the chart regarding the reason for referral, but I see that she complained of problems remembering things since leaving the military at an appointment with Dr. Salomon FickBanks in January, prompting a referral for neuropsychological evaluation. I see that she is also seeing [facility] for attention issues.   On interview, the patient reported that she has a hard time remembering things from the remote past. She served in the [employer] from 2007 - 2015 and noticed her problems after that. This was a time of "intense processing" for her in terms of defining her identity apart from the [employer] and the like. She noticed that her friends would tell stories from childhood and she felt as though she had fewer and/or less detailed memory than they did. This was disturbing to her because she feels like that should be a major foundation of her identity. She also feels like her memory is not great for certain aspects of her time in the [employer]. She talks with friends and they will say "don't you remember that?" and she does not. She will also sometimes remember things differently than they actually were and will change them. In terms of current symptoms, the patient was having a hard time "functioning as an adult" in terms of prioritizing things, making decisions, managing her time and the like and consulted with [facility]. They felt that she was anxious, treated her for that, and transferred her care back to Dr. Salomon FickBanks. She has noticed that she can be a bit spacey, she has a hard time staying with a task,  and if she gets off task it may take her a while to get back to it. She has a hard time with obligations such as recalling appointments and the like if she doesn't write them down. She had to write a personal reference for a friend and completely forgot to do it. She doesn't always deal well with changes. She is working as an Insurance underwriterCU nurse and reported that she has no difficulties functioning on the job. She feels like the organized structure of the day helps her to stay focused, she has not been written up or had any significant performance issues noticed. If she forgets something, it is minor. She has been doing this for three years. She has a harder time figuring out what to do with herself when she is at home and has large periods of unstructured time.   When asked about mood, the patient reported that she was diagnosed with depression in the [employer]. She was prescribed Prozac and tried to go off it and had a hard time doing so, so she got back on it. It sounds like she has good days and bad days, which are often the result of ongoing self-criticism that she should be doing more, that she is not as successful as she should be and that she doesn't have enough motivation. She reported that she had a very hard time with depression after she left the [employer], she had [place] from [place] and was isolated. At that time, she was also having a hard time functioning, she was in bed a lot, she  felt relatively incapacitated and "just didn't care." She eventually got into counseling services at a [facility] aimed at aiding transition to civilian life, which was very helpful. She feels like since that time, she has been doing better. On specific review of symptoms, she acknowledged having some diminished interest, variable energy. She has some guilt related to being deployed around the time her mother died related to cancer. This is particularly challenging because she works in healthcare and see people  processing similar circumstances with their loved ones. She has a hard time going to sleep but is generally good about staying asleep when she goes to bed. She almost never feels rested when she gets up. She reported that her appetite is fairly stable, she gained a small amount of weight during the pandemic.   With respect to functioning, the patient reported that she generally does fairly well in various aspects of functioning. She lives in a house with a significant other and her 15 year old son. She is independent with respect to paying the bills, managing medications, community utilization and the like. She has no accidents or problems driving. She is in the process of renovating her house, which is going adequately. She does not have any difficulties functioning at work as above.   Past Medical History and Review of Relevant Studies   Patient Active Problem List   Diagnosis Date Noted   Fluttering heart 12/17/2019   Depression, recurrent (HCC) 11/12/2019    Review of Neuroimaging and Relevant Medical History: The patient has no neuroimaging.   Current Outpatient Medications  Medication Sig Dispense Refill   azithromycin (ZITHROMAX) 500 MG tablet Take 2 tablets (1,000 mg total) by mouth as a single dose as directed 2 tablet 0   buPROPion (WELLBUTRIN XL) 150 MG 24 hr tablet TAKE 1 TABLET BY MOUTH ONCE A DAY 90 tablet 0   COVID-19 mRNA vaccine, Pfizer, 30 MCG/0.3ML injection INJECT AS DIRECTED .3 mL 0   cyclobenzaprine (FLEXERIL) 5 MG tablet TAKE 1 TABLET BY MOUTH 3 TIMES DAILY AS NEEDED FOR MUSCLE SPASMS 30 tablet 3   doxycycline (VIBRA-TABS) 100 MG tablet Take 1 tablet (100 mg total) by mouth 2 (two) times daily. 20 tablet 0   FLUoxetine (PROZAC) 20 MG capsule TAKE 1 CAPSULE BY MOUTH ONCE DAILY 30 capsule 6   FLUoxetine (PROZAC) 20 MG tablet Take 20 mg by mouth daily.     FLUoxetine (PROZAC) 20 MG tablet Take 1 tablet by mouth daily 90 tablet 3   meloxicam (MOBIC) 7.5 MG tablet Take 1  tablet (7.5 mg total) by mouth daily. 30 tablet 3   meloxicam (MOBIC) 7.5 MG tablet TAKE 1 TABLET BY MOUTH ONCE A DAY 30 tablet 3   metroNIDAZOLE (METROGEL) 0.75 % vaginal gel Insert 1 applicatorful vaginally once daily for 5 days. 70 g 0   No current facility-administered medications for this visit.   Family History  Problem Relation Age of Onset   Cancer Mother    Depression Mother    Early death Mother    Miscarriages / [place] Mother    Early death Father    Drug abuse Father    Diabetes Father    Alcohol abuse Father    Birth defects Brother    Early death Brother    Learning disabilities Brother    Hearing loss Maternal Grandfather    Heart disease Maternal Grandfather    Stroke Maternal Grandfather    Stroke Paternal Grandfather    Psychosocial History  Developmental, Educational and Employment History: The patient was raised by her mother, her father struggled with drug use. She is initially from the [place] area and moved here to get to know her father's side of the family after leaving the [employer]. She denied any history of abuse or neglect during childhood. The patient reported that she initially did well in grade school, she then failed the freshmen year of high school because she was frequently truant. She denied that she was into substances, it sounds like she simply was hanging out with the wrong crowd. She completed summer school and was able to reintegrate herself during her Sophomore year. She ended up transferring to a local community college that had a high school program but unfortunately didn't finish. She was taking college level courses at the community college level and stopped going to one class, thereby getting kicked out of the program. I asked about behavioral features relevant to ADHD and she was not aware of the specific details of her behaviors in school although she denied that her mother or teachers ever brought up the issue of ADHD. She  eventually got a GED and joined the [employer], because she felt she needed discipline. 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 She denied any specific personally traumatic experiences but it sounds like she did know people who were lost and was also exposed to some mortar fire. She worked her way up to E5 and was honorably discharged. She ultimately felt as though it was too much for her family so she stopped when her contract expired. 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 She likes her job and feels that the structure suits her well.   Psychiatric History: The patient was previously diagnosed with depression in the military and was on Prozac for many years. That was helpful. She was also involved in services for a year to a year and a half when she moved to the [place] area in 2016. She also recently went for evaluation at [facility] when she reported having a hard time with decision making and the like at her appointment with Dr. Salomon Fick. They thought that she had some anxiety issues and OCD like tendencies and decided to hold off on formal ADHD assessment. The patient has no suicide attempts, she has no inpatient hospitalizations, and she has no history of self-injury.   Substance Use History: The patient doesn't drink much, she drinks moderately in social occasions. She smoked when she was in the [employer] and quit in 2015.   Relationship History and Living Cimcumstances: The patient is previously married and her son is from that relationship. They were together 1 to 2 years and separated in 2009. She is currently in a committed relationship.   Mental Status and Behavioral Observations  Sensorium/Arousal: The patient's level of arousal was awake and alert. Hearing and vision were  adequate for testing purposes. Orientation: The patient was oriented to person, place, time, and situation.  Appearance: Dressed in appropriate, casual clothing Behavior: Pleasant, appropriate, actively engaged in the evaluation Speech/language: Speech was normal in rate, rhythm, volume and prosody Gait/Posture: Gait was normal, narrow based Movement: No concerning symptoms noted on observation Social Comportment: Appropriate and pleasant.  Mood: "Level" Affect: Mainly neutral Thought process/content: Thought process was logical  and goal oriented, she presented as a reliable historian and had a reasonable command of her personal history and timeline. Thought content was appropriate.  Safety: Patient denied thoughts of harming herself or others when asked directly.  Insight: Fair  Test Procedures  [employer] Achievement Test - 4             Word Reading Wechsler Adult Intelligence Scale - IV Neuropsychological Assessment Battery  Memory Module The Dot Counting Test ACS Word Choice Controlled Oral Word Association (F-A-S) Semantic Fluency (Animals) Trail Making Test A & B [employer] Test -64 (Computer Version) Conners Adult ADHD Rating Scale - Self Beck Depression Inventory - II Beck Anxiety Inventory  Plan  Amy Cooke was seen for a psychiatric diagnostic evaluation and neuropsychological testing. She is a pleasant, 36 year old, right-hand dominant, partnered woman with a history of depression/anxiety. She was referred by her PCP Dr. Salomon Fick, reportedly because she felt as though she could not remember things from the remote past. There have also been concerns about ADHD. I conducted a full and complete informed consent with her and explained that we do not, in fact, assess remote memory. She has at times wondered if she is getting early onset dementia and did not end up pursuing ADHD assessment with [facility], so we elected to pursue testing for  an overview of her current cognitive functioning as pertinent to those questions. Full and complete note with impressions, recommendations, and interpretation of test data to follow.   Bettye Boeck Roseanne Reno, PsyD, ABN Clinical Neuropsychologist  Informed Consent  Risks and benefits of the evaluation were discussed with the patient prior to all testing procedures. I conducted a clinical interview and neuropsychological testing (at least two tests) with Amy Cooke and Clare Charon, B.S. (Technician) administered additional test procedures. The patient was able to tolerate the testing procedures and the patient (and/or family if applicable) is likely to benefit from further follow up to receive the diagnosis and treatment recommendations, which will be rendered at the next encounter.

## 2021-03-28 NOTE — Progress Notes (Signed)
NEUROPSYCHOLOGICAL TEST SCORES [facility]  Patient Name: Amy Cooke MRN: [id number] Date of Birth: 02-Feb-1986 Age: 35 y.o. Education: 16 years  Measurement properties of test scores: IQ, Index, and Standard Scores (SS): Mean = 100; Standard Deviation = 15 Scaled Scores (Ss): Mean = 10; Standard Deviation = 3 Z scores (Z): Mean = 0; Standard Deviation = 1 T scores (T); Mean = 50; Standard Deviation = 10  TEST SCORES:    Note: This summary of test scores accompanies the interpretive report and should not be interpreted by unqualified individuals or in isolation without reference to the report. Test scores are relative to age, gender, and educational history as available and appropriate.   Performance Validity        ACS: Raw Descriptor      Word Choice: 49 Within Expectation      The Dot Counting Test: Raw Descriptor      E-Score 7 Within Expectation      Embedded Measures: Raw Descriptor      WAIS-IV Reliable Digit Span 13 Within Expectation      WAIS-IV Reliable Digit Span Revised 18 Within Expectation      Expected Functioning        Wide Range Achievement Test (Word Reading): Standard/Scaled Score Percentile       Word Reading 110 75      Cognitive Testing            Wechsler Adult Intelligence Scale - IV: Standard/Scaled Score Percentile  Full Scale IQ 124 95  General Ability Index 118 88  Verbal Comprehension Index 118 88      Similarities 13 84      Vocabulary 16 98      Information 11 63  Perceptual Reasoning Index 123 94      Block Design 13 84      Matrix Reasoning 14 91      Visual Puzzles 15 95  Working Memory Index 119 90      Digit Span 15 95          Digit Span Forward 12 75          Digit Span Backward 15 95          Digit Span Sequencing 14 91      Arithmetic 12 75  Processing Speed Index 108 70      Symbol Search 12 75      Coding 11 63      Neuropsychological Assessment Battery (Memory Module, Form 1): T-score/Standard  Score Percentile  Memory Index (MEM): 92 30      List Learning           List A Immediate Recall   (6 , 8 , 10) 38 12         List B Immediate Recall   (6) 48 42         List A Short Delayed Recall   (9) 45 31         List A Long Delayed Recall   (9) 45 31         List A Percent Retention   (100 %) --- 54         List A Long Delayed Yes/No Recognition Hits   (10) --- 25         List A Long Delayed Yes/No Recognition False Alarms   (1) --- 54         List A Recognition Discriminability Index ---  38      Shape Learning           Immediate Recognition   (4 , 7 , 7) 44 27         Delayed Recognition   (9) 58 79         Percent Retention   (129 %) --- 84         Delayed Forced-Choice Recognition Hits   (9) --- 79         Delayed Forced-Choice Recognition False Alarms   (0) --- 62         Delayed Forced-Choice Recognition Discriminability --- 79      Story Learning           Immediate Recall   (32, 39) 51 54         Delayed Recall   (32) 39 14         Percent Retention   (82 %) --- 31      Daily Living Memory            Immediate Recall   (26, 21) 49 46          Delayed Recall   (9, 8) 51 54          Percent Retention (100 %) --- 69          Recognition Hits   (10) --- 75      Verbal Fluency: T-score Percentile      Controlled Oral Word Association (F-A-S) 41 18      Semantic Fluency (Animals) 50 50      Trail Making Test: T-Score Percentile      Part A 63 91      Part B 55 69      [place] Card Sorting Test - 64: T-score Percentile      Categories --- >16%ile      Total Errors 59 82      Perseverative Errors 46 34      Nonperseverative Errors 61 86      Conceptual Level Responses 55 70      Rating Scales         Raw Score Descriptor  Beck Depression Inventory - II 26 Moderate  Beck Anxiety Inventory 14 Mild      Conners' Adult ADHD Rating Scale: Self-Report (Long Version) T-Score Percentile      Inattention/Memory Problems 72 99      Hyperactivity/Restlessness 54 66       Impulsivity/Emotional Lability 53 62      Problems with Self-Concept 52 58      DSM-IV Inattentive Symptoms 65 93      DSM-IV Hyperactive-Impulsive Symptoms 52 58      DSM-IV ADHD Symptoms Total 60 84      ADHD Index 52 58   Kaci Dillie V. Roseanne Reno PsyD, ABN Clinical Neuropsychologist

## 2021-04-03 ENCOUNTER — Encounter: Payer: No Typology Code available for payment source | Admitting: Counselor

## 2021-04-05 NOTE — Progress Notes (Signed)
NEUROPSYCHOLOGICAL EVALUATION [facility]  Patient Name: Amy Cooke MRN: [id number] Date of Birth: [birth year] Age: 35 y.o. Education: 16 years  Clinical Impressions  Amy Cooke is a 34 y.o., right-hand dominant, partnered woman with a history of depression who was referred for evaluation by her PCP Dr. Salomon Fick after mentioning that she feels she does not have good recall for things from the remote past. There have also been concerns about ADHD. She consulted with [facility], but they deferred more detailed evaluation because she was not interested in stimulant medication and was more interested in treatment for anxiety. On interview, she reported a number of signs and symptoms that are frequently associated with ADHD, starting in high school. She was frequently truant, which resulted in substandard performance. She then went to a college preparatory program and was expelled because she simply stopped going to a math class impulsively when she had a hard time. She joined the [employer] because she felt that she needed discipline. 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 She has some tendencies towards obsessiveness and compulsiveness. She is performing adequately at work and in other areas.   On neuropsychological assessment, there is suggestion of superior overall intellectual abilities with comparable high levels of performance on measures of verbal abilities, perceptual abilities, and working memory. Her processing speed was an area of relative weakness although still at the high end of the average range. Considering that level of expected performance, there is evidence of marginally weak performance on memory measures and an unusual number of low subtest scores,  but her overall performance was average, thus mitigating concerns about any significant impairment. On self-report rating of ADHD symptoms she reported a level of inattention symptoms that can be considered very much above average and an above average level of DSM ADHD symptoms. She screened in the mild range for anxiety symptoms and in the moderate range for depressive symptoms. Inspection of individual item responses shows her to have tendencies towards self-punitive thinking and negative self-evaluation.   Amy Cooke is thus demonstrating very good overall cognitive ability and memory performance that is marginally below expectations for an individual of her ability yet still within the average range. I do think that her presentation is highly consistent with expectations in the setting of ADHD in a high functioning individual and that she meets criteria for the diagnosis. She also appears to have a depressive disorder, although it is not clear if she meets criteria for a frank major depressive disorder diagnosis. I would also note that depression and self-criticalness are often associated with ADHD as are tendencies towards obsessiveness and anxiety. Her report of subjectively diminished recall for the remote past is unconcerning and may relate to unrealistic expectations for personal memory and/or absentmindedness due to ADHD.   Diagnostic Impressions: Attention-deficit/hyperactivity disorder, predominately inattentive type  Recommendations to be discussed with patient  Your performance and presentation today were associated with truly exceptional levels of overall ability. That is to say, you scored in the very superior range on a measure of overall intellectual abilities, which is better than 95% of other people. Relative to expectations for a person of that ability, your memory performance is just below expectations but you still scored in the average range and the pattern of low scores is not  concerning for frank "impairment."   In terms of the issues that brought you to see me, I do not find your report of subjectively diminished recall for personal experiences abnormal. Individuals vary greatly in the extent to which they can recall details from the past, particularly from the remote past (such as childhood), and this is not necessarily a sign that there is anything wrong with your brain. This is entirely unconcerning for early onset dementia, which affects recent memory more than it does memory from the past.  I do find your presentation highly consistent with expectations in the setting of ADHD. ADHD is a neurodevelopmental condition meaning that at least some signs and symptoms must be present prior to 35 years of age. It sounds as though you have a history of impulsive decision making, such as skipping class, avoiding class, and doing things that you later regret. As an adult you report problems getting organized, difficulties finishing tasks, losing things, problems organizing your day, diminished efficiency when doing things, getting distracted, and being forgetful in day-to-day activities. These are all frequent inattention symptoms of ADHD. Individuals with ADHD also often feel overwhelmed, have tendencies towards obsessiveness and compulsiveness in an attempt to adapt to their organizational issues, and develop negative feelings about the self.   ADHD is also frequently comorbid with depression and you scored in the moderate range for depressive symptoms. It is difficult to differentiate these two conditions, because depression can present with attention and concentration problems similar to those observed in ADHD. My sense is that you probably do have a separate depressive disorder that is relatively well controlled but I think you could still benefit from further treatment. You are already on antidepressants. You are considering psychotherapy but wanted to hold off because you are  moving. My recommendation would be that you establish with a therapist once you move and get situated.   In terms of your ADHD, that could also potentially benefit from treatment. You were hesitant to try stimulant medications and are still functioning adequately at your job, however, so I think this should be approached carefully. I would also like you to know that many individuals with ADHD do not take medications and are able to use behavioral strategies. Consider trying some of the following:  Build routines into your day and stick to them, doing the same thing at the same time helps your body get into a rhythm that makes it harder to forget something you need to do.  Use sticky notes, reminders, a calendar, or your smart phone to provide yourself with reminders, to do lists, and help track appointments.  When you need to do work for school or other things, create an environment that is conducive to that work. This may include putting electronic devices that can be distracting outside the room and working in an area that is quiet and free from distractions.  Break things down into smaller steps to help get started and stop yourself from feeling overwhelmed.  Plan breaks throughout the day where you can get up and move even if it's for just a few minutes at a time.  Focus your attention on only one thing and avoid multitasking. Although some people are better at it than others, nobody's task performance is as good as it could be when they are alternating attention between two different tasks.  Stay mindful throughout your day and monitor whether you are feeling overwhelmed or disorganized to identify problems before they happen.  Avoid working under time  pressure when you may be more liable to make mistakes.   Perhaps most importantly, I would encourage you to adopt an attitude of acceptance, self-compassion, and loving kindness towards yourself. You are clearly a very capable person who has overcome  many obstacles and has many positive qualities to offer the world. You are also extremely bright from an objective testing perspective. Realize that we all have unique strengths, weaknesses, and challenges and that all human being struggle from time to time.   Test Findings  Test scores are summarized in additional documentation associated with this encounter. Test scores are relative to age, gender, and educational history as available and appropriate. There were no concerns about performance validity as all findings fell within normal expectations.   General Intellectual Functioning/Achievement:  Performance on measures of intellectually relevant cognitive abilities generated a very superior score on the overall FSIQ index from the WAIS-IV. There was broad consistency between component indices, albeit with a pattern notable for some strengths and weaknesses. Her scores clustered toward the margin of the high average and superior ranges on measures of perceptual reasoning and verbal comprehension. Working memory was high average. Processing speed was her lowest score, but was still toward the upper aspect of the average range. These scores suggest a very stringent standard of comparison is appropriate for her cognitive test performance. Single word reading was high average.   Attention and Processing Efficiency: Performance on the Working Memory Index of the WAIS-IV was high average. She demonstrated average digit repetition forward, superior digit repetition backward, and high average digit resequencing in ascending order. Performance on mental solving of arithemtical word problems without paper and pencil was average.   Indicators of processing speed showed performance toward the upper end of the average range on the Processing Speed Index of the WAIS-IV. She performed in the average range on timed number-symbol coding and on a measure of efficient visual matching and efficient visual scanning.    Language: Performance on language measures was notable for good scores with a high average range score on the Verbal Comprehension Index from the WAIS-IV. Vocabulary knowledge was truly outstanding and in the very superior range. Fund of information was average. Performance was high average on a measure of abstract verbal reasoning involving identification of similarities.   Visuospatial Function: Performance on measures of perceptual skills was very good with an overall superior range score on the Perceptual Reasoning Index of the WAIS-IV. Colored block assembly was high average. She scored in the superior range on a task involving solving increasingly difficult matrices, a measure of fluid intelligence and reasoning. Solving increasingly complex visual puzzles was very good, with a score in the superior range.   Learning and Memory: Performance on measures of learning and memory fell toward the lower end of the average range. While this is a reasonable performance level, it is in fact marginally below expectations for an individual of Ms. Glenna FellowsMead's overall cognitive ability. The pattern of scores showed some minor inconsistencies with low average initial word list learning and story recall in the setting of average or better performance elsewhere. The low scores and overall performance level may be picking up on subtle cognitive inefficiencies but the profile is certainly not concerning for impairment.   In the verbal realm, Ms. Bethena MidgetMead  learned 4, 7, and 7 words of a 12-item word list across three learning trials, which is low average. She recalled 9 words at short and long delayed recall, which is average, and had comparable average range  performance on delayed yes/no recognition discriminability. Performance for a short story was average on immediate recall although delayed recall was low average. Nevertheless, her retention of information over time was good. Memory for brief daily-living type  information was average on immediate and delayed recall. Recognition was errorless with high average performance.   In the visual realm, Ms. Kitts demonstrated average immediate recognition for a series of designs that are difficult to verbally encode. Her delayed recall for the designs was high average. Performance on yes/no recognition discriminability testing was high average.   Executive Functions: Performance on executive measures was generally good. She scored at an average or better level for total errors and perseverative errors on a card sorting measure involving concept formation and cognitive flexibility. Categories completed was normal. Performance was average on alternating sequencing of numbers and letters of the alphabet. Performance was low average generating words on the basis of letter cues. Working [employer] fast streamer, which is a facet of executive function and executive control was good and in the high average (nearly superior range) on the WAIS-IV. Abstraction on matrix reasoning and similarities was also good.   Rating Scale(s): Ms. Salih reported moderate levels of depressive symptoms and mild levels of anxiety symptoms on self-rating measures of affective issues. She completed the CAARS, a self-report questionnaire of symptoms associated with ADHD. On this measure she reported levels of inattention/memory problems that can be considered very much above average. Her level of ADHD inattentive symptoms was above average. Other scales were within normal limits and her ADHD total symptoms was in the above average range. Inspection of individual items in conjunction with other self-report inventories and clinical interview data suggests that she does meet clinical criteria for ADHD.   Bettye Boeck Roseanne Reno, PsyD, ABN Clinical Neuropsychologist  Coding and Compliance  Billing below reflects technician time, my direct face-to-face time with the patient, time spent in test administration, and time spent in  professional activities including but not limited to: neuropsychological test interpretation, integration of neuropsychological test data with clinical history, report preparation, treatment planning, care coordination, and review of diagnostically pertinent medical history or studies.   Services associated with this encounter: Clinical Interview [phone]) plus 200 minutes ([id number]; Neuropsychological Evaluation by Professional)  160 minutes ([id number]; Neuropsychological Testing by Technician)

## 2021-04-10 ENCOUNTER — Encounter: Payer: No Typology Code available for payment source | Admitting: Counselor

## 2021-04-17 ENCOUNTER — Encounter: Payer: Self-pay | Admitting: Family Medicine

## 2021-04-17 ENCOUNTER — Other Ambulatory Visit (HOSPITAL_COMMUNITY): Payer: Self-pay

## 2021-04-19 ENCOUNTER — Encounter: Payer: Self-pay | Admitting: Counselor

## 2021-04-19 ENCOUNTER — Other Ambulatory Visit: Payer: Self-pay

## 2021-04-19 ENCOUNTER — Ambulatory Visit (INDEPENDENT_AMBULATORY_CARE_PROVIDER_SITE_OTHER): Payer: No Typology Code available for payment source | Admitting: Counselor

## 2021-04-19 DIAGNOSIS — F900 Attention-deficit hyperactivity disorder, predominantly inattentive type: Secondary | ICD-10-CM | POA: Insufficient documentation

## 2021-04-19 NOTE — Progress Notes (Signed)
NEUROPSYCHOLOGY FEEDBACK NOTE [facility]  Feedback Note: I met with Ty Hilts to review the findings resulting from her neuropsychological evaluation. Since the last appointment, she has been about the same. She and her boyfriend are moving to [facility], which we discussed briefly. Time was spent reviewing the impressions and recommendations that are detailed in the evaluation report. We discussed impression of very high, superior range abilities and marginally weak memory performance in light of that standard. I was candid with her that I do think she has ADHD, and we discussed treatments and ways of compensating. She is not sure that she is interested in medications. Other topics of discussion and interventions as reflected in the patient instructions. I took time to explain the findings and answer all the patient's questions. I encouraged Ms. Trautner to contact me should she have any further questions or if further follow up is desired.   Current Medications and Medical History   Current Outpatient Medications  Medication Sig Dispense Refill   azithromycin (ZITHROMAX) 500 MG tablet Take 2 tablets (1,000 mg total) by mouth as a single dose as directed 2 tablet 0   buPROPion (WELLBUTRIN XL) 150 MG 24 hr tablet TAKE 1 TABLET BY MOUTH ONCE A DAY 90 tablet 0   COVID-19 mRNA vaccine, Pfizer, 30 MCG/0.3ML injection INJECT AS DIRECTED .3 mL 0   cyclobenzaprine (FLEXERIL) 5 MG tablet TAKE 1 TABLET BY MOUTH 3 TIMES DAILY AS NEEDED FOR MUSCLE SPASMS 30 tablet 3   doxycycline (VIBRA-TABS) 100 MG tablet Take 1 tablet (100 mg total) by mouth 2 (two) times daily. 20 tablet 0   FLUoxetine (PROZAC) 20 MG capsule TAKE 1 CAPSULE BY MOUTH ONCE DAILY 30 capsule 6   FLUoxetine (PROZAC) 20 MG tablet Take 20 mg by mouth daily.     FLUoxetine (PROZAC) 20 MG capsule Take 1 capsule (20 mg total) by mouth daily. 90 capsule 3   meloxicam (MOBIC) 7.5 MG tablet Take 1 tablet (7.5 mg total) by mouth daily. 30  tablet 3   meloxicam (MOBIC) 7.5 MG tablet TAKE 1 TABLET BY MOUTH ONCE A DAY 30 tablet 3   metroNIDAZOLE (METROGEL) 0.75 % vaginal gel Insert 1 applicatorful vaginally once daily for 5 days. 70 g 0   No current facility-administered medications for this visit.    Patient Active Problem List   Diagnosis Date Noted   Fluttering heart 12/17/2019   Depression, recurrent ([place]) 11/12/2019    Mental Status and Behavioral Observations  Ty Hilts presented on time to the present encounter and was alert and generally oriented. Speech was normal in rate, rhythm, volume, and prosody. Self-reported mood was "ok" and affect was mainly neutral. Thought process was logical and goal oriented and thought content was appropriate to the topics discussed. There were no safety concerns identified at today's encounter, such as thoughts of harming self or others.   Plan  Feedback provided regarding the patient's neuropsychological evaluation. She is very bright and also likely has ADHD. She is moving so does not want to establish services now, but she is aware of self-help strategies such as using routine, organization and the like. She was provided a copy of the report should she wish to establish services after her move. Delilah Marnie Fazzino was encouraged to contact me if any questions arise or if further follow up is desired.   Viviano Simas Nicole Kindred, PsyD, ABN Clinical Neuropsychologist  Service(s) Provided at This Encounter: 33 minutes [phone]; Psychotherapy with patient/family)

## 2021-04-19 NOTE — Patient Instructions (Signed)
Your performance and presentation today were associated with truly exceptional levels of overall ability. That is to say, you scored in the very superior range on a measure of overall intellectual abilities, which is better than 95% of other people. Relative to expectations for a person of that ability, your memory performance is just below expectations but you still scored in the average range and the pattern of low scores is not concerning for frank "impairment."   In terms of the issues that brought you to see me, I do not find your report of subjectively diminished recall for personal experiences abnormal. Individuals vary greatly in the extent to which they can recall details from the past, particularly from the remote past (such as childhood), and this is not necessarily a sign that there is anything wrong with your brain. This is entirely unconcerning for early onset dementia, which affects recent memory more than it does memory from the past.  I do find your presentation highly consistent with expectations in the setting of ADHD. ADHD is a neurodevelopmental condition meaning that at least some signs and symptoms must be present prior to 35 years of age. It sounds as though you have a history of impulsive decision making, such as skipping class, avoiding class, and doing things that you later regret. As an adult you report problems getting organized, difficulties finishing tasks, losing things, problems organizing your day, diminished efficiency when doing things, getting distracted, and being forgetful in day-to-day activities. These are all frequent inattention symptoms of ADHD. Individuals with ADHD also often feel overwhelmed, have tendencies towards obsessiveness and compulsiveness in an attempt to adapt to their organizational issues, and develop negative feelings about the self.   ADHD is also frequently comorbid with depression and you scored in the moderate range for depressive symptoms. It is  difficult to differentiate these two conditions, because depression can present with attention and concentration problems similar to those observed in ADHD. My sense is that you probably do have a separate depressive disorder that is relatively well controlled but I think you could still benefit from further treatment. You are already on antidepressants. You are considering psychotherapy but wanted to hold off because you are moving. My recommendation would be that you establish with a therapist once you move and get situated.   In terms of your ADHD, that could also potentially benefit from treatment. You were hesitant to try stimulant medications and are still functioning adequately at your job, however, so I think this should be approached carefully. I would also like you to know that many individuals with ADHD do not take medications and are able to use behavioral strategies. Consider trying some of the following:   Build routines into your day and stick to them, doing the same thing at the same time helps your body get into a rhythm that makes it harder to forget something you need to do. Use sticky notes, reminders, a calendar, or your smart phone to provide yourself with reminders, to do lists, and help track appointments. When you need to do work for school or other things, create an environment that is conducive to that work. This may include putting electronic devices that can be distracting outside the room and working in an area that is quiet and free from distractions. Break things down into smaller steps to help get started and stop yourself from feeling overwhelmed. Plan breaks throughout the day where you can get up and move even if it's for just a few  minutes at a time. Focus your attention on only one thing and avoid multitasking. Although some people are better at it than others, nobody's task performance is as good as it could be when they are alternating attention between two different  tasks. Stay mindful throughout your day and monitor whether you are feeling overwhelmed or disorganized to identify problems before they happen. Avoid working under time pressure when you may be more liable to make mistakes.   Perhaps most importantly, I would encourage you to adopt an attitude of acceptance, self-compassion, and loving kindness towards yourself. You are clearly a very capable person who has overcome many obstacles and has many positive qualities to offer the world. You are also extremely bright from an objective testing perspective. Realize that we all have unique strengths, weaknesses, and challenges and that all human being struggle from time to time.

## 2021-04-24 ENCOUNTER — Other Ambulatory Visit: Payer: Self-pay | Admitting: Family Medicine

## 2021-04-24 ENCOUNTER — Other Ambulatory Visit (HOSPITAL_COMMUNITY): Payer: Self-pay

## 2021-04-24 DIAGNOSIS — F339 Major depressive disorder, recurrent, unspecified: Secondary | ICD-10-CM

## 2021-04-25 ENCOUNTER — Other Ambulatory Visit (HOSPITAL_COMMUNITY): Payer: Self-pay

## 2021-04-25 NOTE — Telephone Encounter (Signed)
Please clarify.  Pt on Wellbutrin XL at last visit, but Prozac also listed as current med rx'd by another provider.

## 2021-04-27 ENCOUNTER — Other Ambulatory Visit (HOSPITAL_COMMUNITY): Payer: Self-pay

## 2021-04-28 ENCOUNTER — Other Ambulatory Visit (HOSPITAL_COMMUNITY): Payer: Self-pay

## 2021-04-30 ENCOUNTER — Other Ambulatory Visit (HOSPITAL_COMMUNITY): Payer: Self-pay

## 2021-05-02 ENCOUNTER — Other Ambulatory Visit: Payer: Self-pay | Admitting: Family Medicine

## 2021-05-02 ENCOUNTER — Other Ambulatory Visit (HOSPITAL_COMMUNITY): Payer: Self-pay

## 2021-05-02 DIAGNOSIS — F339 Major depressive disorder, recurrent, unspecified: Secondary | ICD-10-CM

## 2021-05-03 ENCOUNTER — Other Ambulatory Visit (HOSPITAL_COMMUNITY): Payer: Self-pay

## 2021-05-04 ENCOUNTER — Other Ambulatory Visit (HOSPITAL_COMMUNITY): Payer: Self-pay

## 2021-05-07 ENCOUNTER — Other Ambulatory Visit (HOSPITAL_COMMUNITY): Payer: Self-pay

## 2021-06-12 ENCOUNTER — Other Ambulatory Visit: Payer: Self-pay

## 2021-06-12 DIAGNOSIS — F339 Major depressive disorder, recurrent, unspecified: Secondary | ICD-10-CM

## 2021-06-12 MED ORDER — BUPROPION HCL ER (XL) 150 MG PO TB24: ORAL_TABLET | Freq: Every day | ORAL | 0 refills | Status: DC

## 2021-06-12 MED ORDER — BUPROPION HCL ER (XL) 150 MG PO TB24: ORAL_TABLET | Freq: Every day | ORAL | 0 refills | Status: AC

## 2021-06-14 ENCOUNTER — Other Ambulatory Visit (HOSPITAL_COMMUNITY): Payer: Self-pay

## 2021-09-21 ENCOUNTER — Other Ambulatory Visit: Payer: Self-pay | Admitting: Family Medicine

## 2021-09-21 DIAGNOSIS — F339 Major depressive disorder, recurrent, unspecified: Secondary | ICD-10-CM

## 2021-09-26 NOTE — Telephone Encounter (Signed)
If pt has not already moved out of state she should be seen for further refills.  Otherwise she should be getting refills from new provider.

## 2021-09-27 NOTE — Addendum Note (Signed)
Addended by: Kathreen Devoid on: 09/27/2021 07:08 AM   Modules accepted: Orders

## 2021-10-02 ENCOUNTER — Other Ambulatory Visit: Payer: Self-pay | Admitting: Family Medicine

## 2021-10-02 DIAGNOSIS — F339 Major depressive disorder, recurrent, unspecified: Secondary | ICD-10-CM

## 2021-12-15 IMAGING — DX DG LUMBAR SPINE COMPLETE 4+V
5 series · 5 of 5 positions shown · non-contrast
Comparison: CT abdomen and pelvis-11/18/2018

CLINICAL DATA: Chronic low back pain with sciatica.

EXAM:
LUMBAR SPINE - COMPLETE 4+ VIEW

[lumbar spine ap]
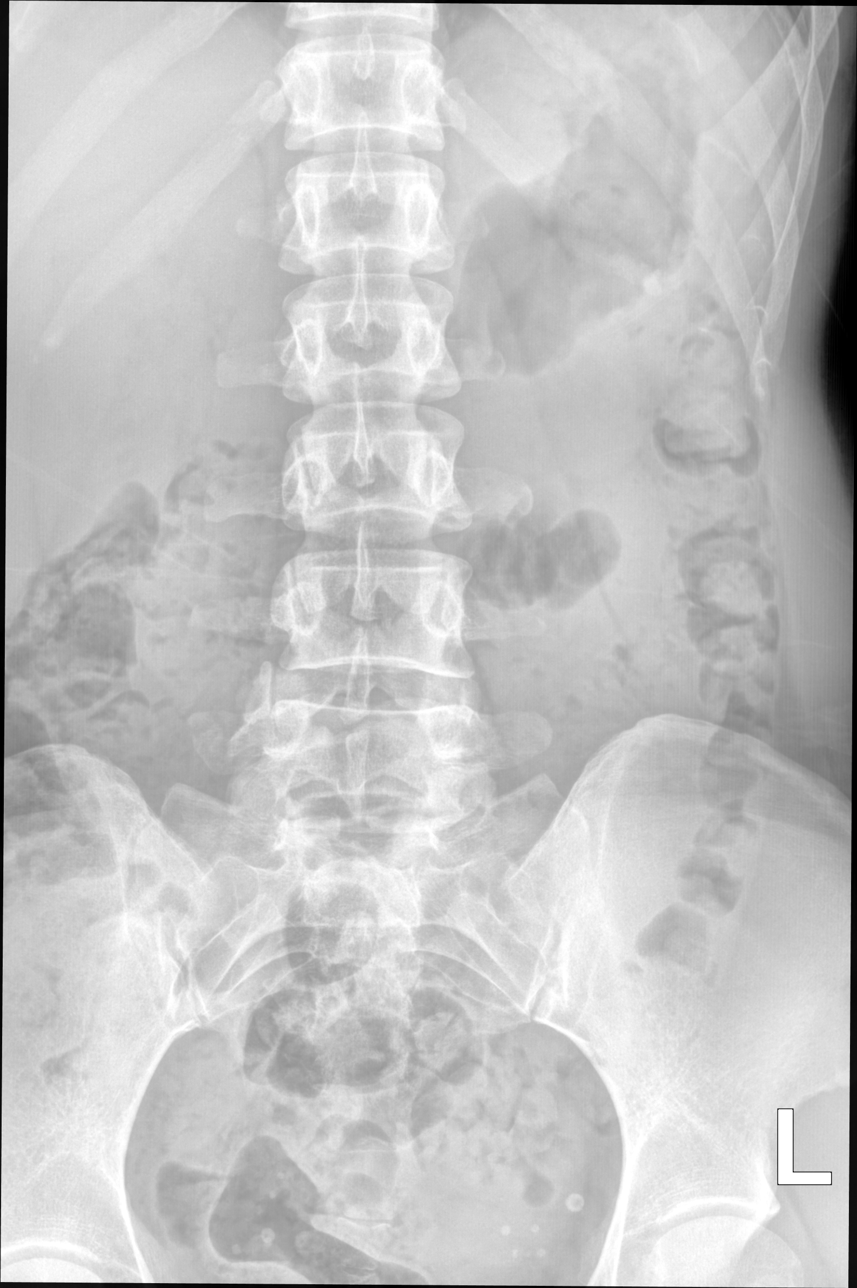

[lumbar spine oblique (1 of 2)]
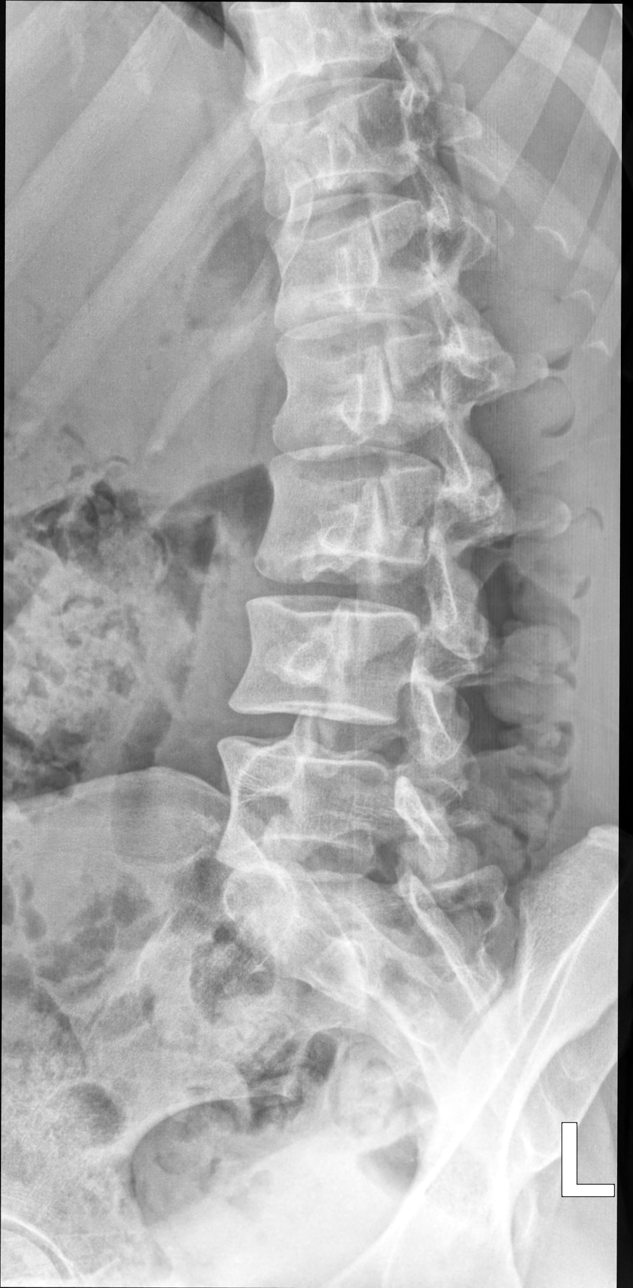

[lumbar spine oblique (2 of 2)]
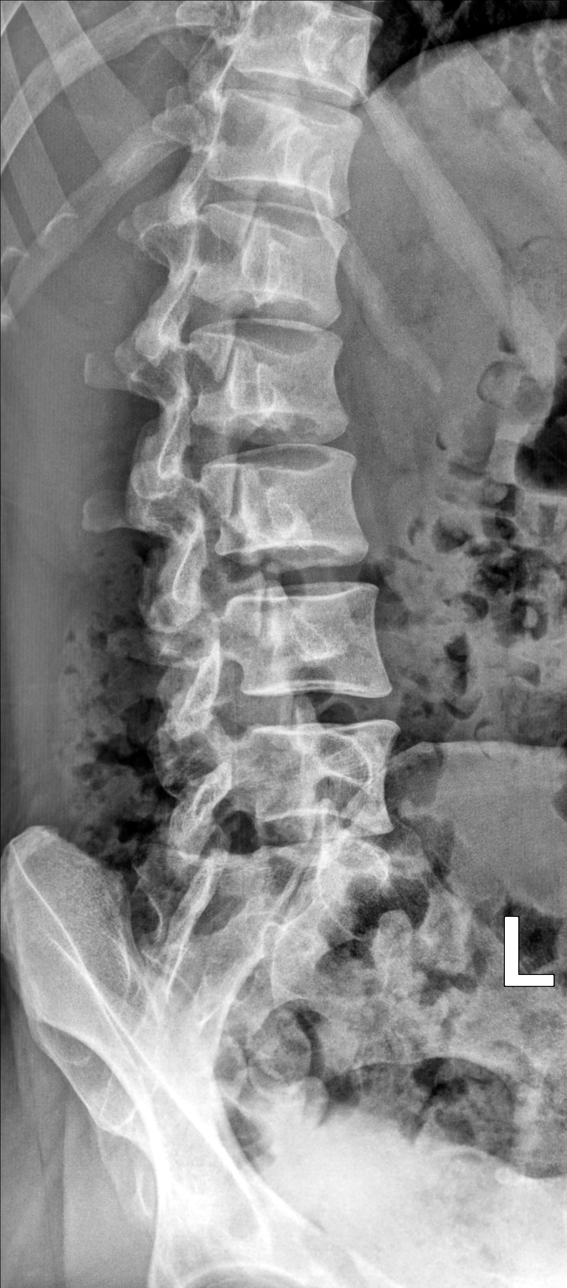

[lumbar spine lat spot]
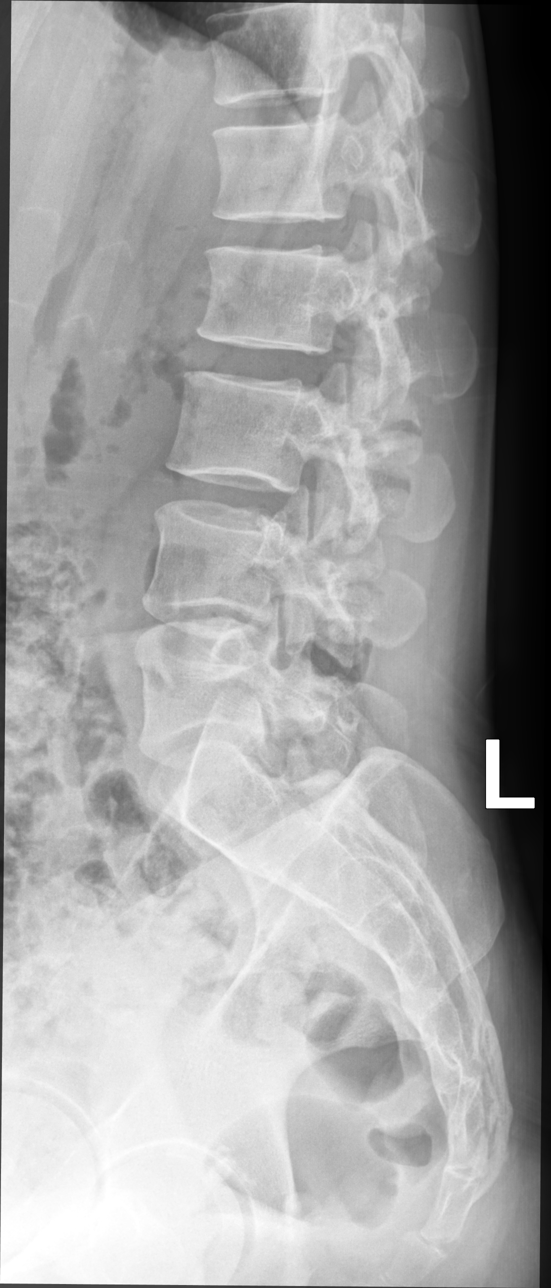

[lumbar spine lat]
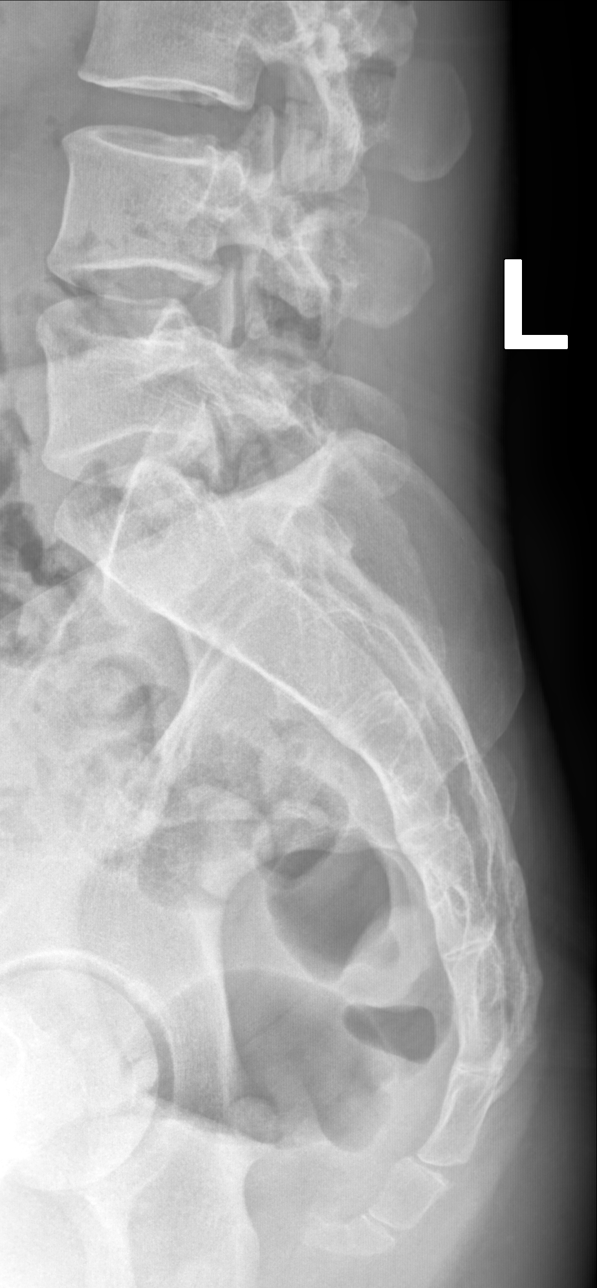

[5 of 5 positions shown; findings below may reference images not displayed]

FINDINGS: There are 5 non rib-bearing lumbar type vertebral bodies.

Straightening and slight reversal of the expected lumbar lordosis.
No anterolisthesis or retrolisthesis. No definite pars defects.

Lumbar vertebral body heights appear preserved.

Lumbar intervertebral disc space heights appear preserved given
obliquity.

Limited visualization of the bilateral SI joints is normal.

Large colonic stool burden without evidence of enteric obstruction.
Phleboliths overlie the lower pelvis bilaterally.
IMPRESSION: Straightening and slight reversal of the expected lumbar lordosis,
nonspecific though could be seen in the setting of muscle spasm.
